# Patient Record
Sex: Female | Born: 1945
Health system: Southern US, Community
[De-identification: ages and names within clinical notes are randomized; demographics above are authoritative.]

## PROBLEM LIST (undated history)

## (undated) DIAGNOSIS — F419 Anxiety disorder, unspecified: Secondary | ICD-10-CM

## (undated) DIAGNOSIS — M810 Age-related osteoporosis without current pathological fracture: Secondary | ICD-10-CM

## (undated) DIAGNOSIS — Z87442 Personal history of urinary calculi: Secondary | ICD-10-CM

## (undated) DIAGNOSIS — N200 Calculus of kidney: Secondary | ICD-10-CM

## (undated) DIAGNOSIS — K219 Gastro-esophageal reflux disease without esophagitis: Secondary | ICD-10-CM

## (undated) DIAGNOSIS — C801 Malignant (primary) neoplasm, unspecified: Secondary | ICD-10-CM

## (undated) DIAGNOSIS — D649 Anemia, unspecified: Secondary | ICD-10-CM

## (undated) DIAGNOSIS — E785 Hyperlipidemia, unspecified: Secondary | ICD-10-CM

## (undated) DIAGNOSIS — M722 Plantar fascial fibromatosis: Secondary | ICD-10-CM

## (undated) DIAGNOSIS — K589 Irritable bowel syndrome without diarrhea: Secondary | ICD-10-CM

## (undated) DIAGNOSIS — R002 Palpitations: Secondary | ICD-10-CM

## (undated) DIAGNOSIS — L570 Actinic keratosis: Secondary | ICD-10-CM

## (undated) DIAGNOSIS — H9190 Unspecified hearing loss, unspecified ear: Secondary | ICD-10-CM

## (undated) HISTORY — DX: Actinic keratosis: L57.0

## (undated) HISTORY — PX: LITHOTRIPSY: SUR834

---

## 2007-04-12 ENCOUNTER — Ambulatory Visit: Payer: Self-pay | Admitting: Internal Medicine

## 2007-05-15 ENCOUNTER — Ambulatory Visit: Payer: Self-pay | Admitting: Gastroenterology

## 2008-05-09 ENCOUNTER — Ambulatory Visit: Payer: Self-pay | Admitting: Internal Medicine

## 2009-05-16 ENCOUNTER — Ambulatory Visit: Payer: Self-pay | Admitting: Internal Medicine

## 2009-12-01 ENCOUNTER — Ambulatory Visit: Payer: Self-pay | Admitting: Gastroenterology

## 2011-06-15 ENCOUNTER — Ambulatory Visit: Payer: Self-pay | Admitting: Internal Medicine

## 2012-07-18 ENCOUNTER — Ambulatory Visit: Payer: Self-pay | Admitting: Internal Medicine

## 2013-07-19 ENCOUNTER — Ambulatory Visit: Payer: Self-pay | Admitting: Internal Medicine

## 2014-07-12 ENCOUNTER — Other Ambulatory Visit: Payer: Self-pay | Admitting: Internal Medicine

## 2014-07-12 DIAGNOSIS — Z1231 Encounter for screening mammogram for malignant neoplasm of breast: Secondary | ICD-10-CM

## 2014-07-23 ENCOUNTER — Ambulatory Visit
Admission: RE | Admit: 2014-07-23 | Discharge: 2014-07-23 | Disposition: A | Discharge status: Home / Self Care | Payer: PPO | Source: Ambulatory Visit | Attending: Internal Medicine | Admitting: Internal Medicine

## 2014-07-23 DIAGNOSIS — Z1231 Encounter for screening mammogram for malignant neoplasm of breast: Secondary | ICD-10-CM

## 2015-02-01 ENCOUNTER — Encounter: Payer: Self-pay | Admitting: *Deleted

## 2015-02-03 ENCOUNTER — Ambulatory Visit
Admission: RE | Admit: 2015-02-03 | Discharge: 2015-02-03 | Disposition: A | Discharge status: Home / Self Care | Payer: PPO | Source: Ambulatory Visit | Attending: Gastroenterology | Admitting: Gastroenterology

## 2015-02-03 ENCOUNTER — Ambulatory Visit: Payer: PPO | Admitting: Anesthesiology

## 2015-02-03 ENCOUNTER — Encounter: Payer: Self-pay | Admitting: *Deleted

## 2015-02-03 ENCOUNTER — Encounter
Admission: RE | Disposition: A | Discharge status: Home / Self Care | Payer: Self-pay | Source: Ambulatory Visit | Attending: Gastroenterology

## 2015-02-03 DIAGNOSIS — Z79899 Other long term (current) drug therapy: Secondary | ICD-10-CM | POA: Insufficient documentation

## 2015-02-03 DIAGNOSIS — K640 First degree hemorrhoids: Secondary | ICD-10-CM | POA: Insufficient documentation

## 2015-02-03 DIAGNOSIS — K5730 Diverticulosis of large intestine without perforation or abscess without bleeding: Secondary | ICD-10-CM | POA: Diagnosis not present

## 2015-02-03 DIAGNOSIS — M810 Age-related osteoporosis without current pathological fracture: Secondary | ICD-10-CM | POA: Diagnosis not present

## 2015-02-03 DIAGNOSIS — K648 Other hemorrhoids: Secondary | ICD-10-CM | POA: Diagnosis not present

## 2015-02-03 DIAGNOSIS — Z1211 Encounter for screening for malignant neoplasm of colon: Secondary | ICD-10-CM | POA: Diagnosis not present

## 2015-02-03 DIAGNOSIS — E785 Hyperlipidemia, unspecified: Secondary | ICD-10-CM | POA: Insufficient documentation

## 2015-02-03 DIAGNOSIS — K219 Gastro-esophageal reflux disease without esophagitis: Secondary | ICD-10-CM | POA: Insufficient documentation

## 2015-02-03 DIAGNOSIS — Z86010 Personal history of colonic polyps: Secondary | ICD-10-CM | POA: Insufficient documentation

## 2015-02-03 DIAGNOSIS — K5790 Diverticulosis of intestine, part unspecified, without perforation or abscess without bleeding: Secondary | ICD-10-CM | POA: Diagnosis not present

## 2015-02-03 HISTORY — DX: Plantar fascial fibromatosis: M72.2

## 2015-02-03 HISTORY — DX: Hyperlipidemia, unspecified: E78.5

## 2015-02-03 HISTORY — DX: Calculus of kidney: N20.0

## 2015-02-03 HISTORY — PX: COLONOSCOPY WITH PROPOFOL: SHX5780

## 2015-02-03 HISTORY — DX: Gastro-esophageal reflux disease without esophagitis: K21.9

## 2015-02-03 HISTORY — DX: Age-related osteoporosis without current pathological fracture: M81.0

## 2015-02-03 HISTORY — DX: Irritable bowel syndrome, unspecified: K58.9

## 2015-02-03 SURGERY — COLONOSCOPY WITH PROPOFOL
Anesthesia: General

## 2015-02-03 MED ORDER — SODIUM CHLORIDE 0.9 % IV SOLN
INTRAVENOUS | Status: DC
  Administered 2015-02-03: 09:00:00 via INTRAVENOUS

## 2015-02-03 MED ORDER — FENTANYL CITRATE (PF) 100 MCG/2ML IJ SOLN
INTRAMUSCULAR | Status: DC | PRN
  Administered 2015-02-03: 50 ug via INTRAVENOUS

## 2015-02-03 MED ORDER — SODIUM CHLORIDE 0.9 % IV SOLN: INTRAVENOUS | Status: DC

## 2015-02-03 MED ORDER — MIDAZOLAM HCL 2 MG/2ML IJ SOLN
INTRAMUSCULAR | Status: DC | PRN
  Administered 2015-02-03: 1 mg via INTRAVENOUS

## 2015-02-03 MED ORDER — PROPOFOL 500 MG/50ML IV EMUL
INTRAVENOUS | Status: DC | PRN
  Administered 2015-02-03: 120 ug/kg/min via INTRAVENOUS

## 2015-02-03 NOTE — Anesthesia Procedure Notes (Signed)
Performed by: COOK-MARTIN, Emanie Behan Pre-anesthesia Checklist: Patient identified, Emergency Drugs available, Suction available, Patient being monitored and Timeout performed Patient Re-evaluated:Patient Re-evaluated prior to inductionOxygen Delivery Method: Nasal cannula Preoxygenation: Pre-oxygenation with 100% oxygen Intubation Type: IV induction Placement Confirmation: positive ETCO2 and CO2 detector       

## 2015-02-03 NOTE — Op Note (Signed)
[facility] Patient Name: Barbara Waters Procedure Date: 02/03/2015 10:06 AM MRN: [id number] Account #: [id number] Date of Birth: 1945/07/23 Admit Type: Outpatient Age: 70 Room: [facility] ENDO ROOM 3 Gender: Female Note Status: Finalized Procedure:         Colonoscopy Indications:       Personal history of colonic polyps Providers:         Lollie Sails, MD Referring MD:      Hewitt Blade. Sarina Ser, MD (Referring MD) Medicines:         Monitored Anesthesia Care Complications:     No immediate complications. Procedure:         Pre-Anesthesia Assessment:                    - ASA Grade Assessment: II - A patient with mild systemic                     disease.                    After obtaining informed consent, the colonoscope was                     passed under direct vision. Throughout the procedure, the                     patient's blood pressure, pulse, and oxygen saturations                     were monitored continuously. The Olympus PCF-H180AL                     colonoscope ( S#: [id number] ) was introduced through the                     anus and advanced to the the cecum, identified by                     appendiceal orifice and ileocecal valve. The colonoscopy                     was performed without difficulty. The patient tolerated                     the procedure well. The quality of the bowel preparation                     was good. Findings:      Multiple medium-mouthed diverticula were found in the sigmoid colon.      The exam was otherwise normal throughout the examined colon.      The digital rectal exam was normal.      Non-bleeding internal hemorrhoids were found during anoscopy. The       hemorrhoids were Grade I (internal hemorrhoids that do not prolapse). Impression:        - Diverticulosis in the sigmoid colon.                    - Non-bleeding internal hemorrhoids.                    - No specimens collected. Recommendation:     - Discharge patient to home. Procedure Code(s): --- Professional ---  45378, Colonoscopy, flexible; diagnostic, including                     collection of specimen(s) by brushing or washing, when                     performed (separate procedure) Diagnosis Code(s): --- Professional ---                    K64.0, First degree hemorrhoids                    Z86.010, Personal history of colonic polyps                    K57.30, Diverticulosis of large intestine without                     perforation or abscess without bleeding CPT copyright 2014 American Medical Association. All rights reserved. The codes documented in this report are preliminary and upon coder review may  be revised to meet current compliance requirements. Lollie Sails, MD 02/03/2015 10:24:37 AM This report has been signed electronically. Number of Addenda: 0 Note Initiated On: 02/03/2015 10:06 AM Scope Withdrawal Time: 0 hours 5 minutes 17 seconds  Total Procedure Duration: 0 hours 12 minutes 11 seconds       [facility]

## 2015-02-03 NOTE — Anesthesia Postprocedure Evaluation (Signed)
Anesthesia Post Note  Patient: Barbara Waters  Procedure(s) Performed: Procedure(s) (LRB): COLONOSCOPY WITH PROPOFOL (N/A)  Patient location during evaluation: Endoscopy Anesthesia Type: General Level of consciousness: awake and alert Pain management: pain level controlled Vital Signs Assessment: post-procedure vital signs reviewed and stable Respiratory status: spontaneous breathing, nonlabored ventilation, respiratory function stable and patient connected to nasal cannula oxygen Cardiovascular status: blood pressure returned to baseline and stable Postop Assessment: no signs of nausea or vomiting Anesthetic complications: no    Last Vitals:  Filed Vitals:   02/03/15 1040 02/03/15 1050  BP: 110/68 120/61  Pulse: 66 72  Temp:    Resp: 18 15    Last Pain: There were no vitals filed for this visit.               Martha Clan

## 2015-02-03 NOTE — H&P (Signed)
Outpatient short stay form Pre-procedure 02/03/2015 10:02 AM Barbara Sails MD  Primary Physician: Dr. Lisette Grinder  Reason for visit:  Colonoscopy  History of present illness:  Patient is a 70 year old female with a personal history of adenomatous colon polyps. She is presenting today for a colonoscopy. He tolerated her prep well. She takes Excedrin is on a daily basis but states that she has not taken any in the past 5 days. She takes no other aspirin or blood thinning products.    Current facility-administered medications:  .  0.9 %  sodium chloride infusion, , Intravenous, Continuous, Barbara Sails, MD, Last Rate: 20 mL/hr at 02/03/15 0850 .  0.9 %  sodium chloride infusion, , Intravenous, Continuous, Barbara Sails, MD  Prescriptions prior to admission  Medication Sig Dispense Refill Last Dose  . alendronate (FOSAMAX) 70 MG tablet Take 70 mg by mouth once a week. Take with a full glass of water on an empty stomach.     . pravastatin (PRAVACHOL) 40 MG tablet Take 40 mg by mouth daily.   02/02/2015 at Unknown time  . RABEprazole (ACIPHEX) 20 MG tablet Take 20 mg by mouth daily.   02/02/2015 at Unknown time     No Known Allergies   Past Medical History  Diagnosis Date  . GERD (gastroesophageal reflux disease)   . Nephrolithiasis   . IBS (irritable bowel syndrome)   . Osteoporosis   . Hyperlipidemia   . Plantar fascial fibromatosis     Review of systems:      Physical Exam    Heart and lungs: Regular rate and rhythm without rub or gallop, lungs are bilaterally clear.    HEENT: Normocephalic atraumatic eyes are anicteric    Other:     Pertinant exam for procedure: Soft nontender nondistended bowel sounds positive normoactive    Planned proceedures: Colonoscopy and indicated procedures. I have discussed the risks benefits and complications of procedures to include not limited to bleeding, infection, perforation and the risk of sedation and the patient wishes  to proceed.    Barbara Sails, MD Gastroenterology 02/03/2015  10:02 AM

## 2015-02-03 NOTE — Transfer of Care (Signed)
Immediate Anesthesia Transfer of Care Note  Patient: Barbara Waters  Procedure(s) Performed: Procedure(s): COLONOSCOPY WITH PROPOFOL (N/A)  Patient Location: PACU  Anesthesia Type:General  Level of Consciousness: awake and sedated  Airway & Oxygen Therapy: Patient Spontanous Breathing and Patient connected to nasal cannula oxygen  Post-op Assessment: Report given to RN and Post -op Vital signs reviewed and stable  Post vital signs: Reviewed and stable  Last Vitals:  Filed Vitals:   02/03/15 0825  BP: 134/71  Pulse: 86  Temp: 36.9 C  Resp: 14    Complications: No apparent anesthesia complications

## 2015-02-03 NOTE — Anesthesia Preprocedure Evaluation (Signed)
Anesthesia Evaluation  Patient identified by MRN, date of birth, ID band Patient awake    Reviewed: Allergy & Precautions, H&P , NPO status , Patient's Chart, lab work & pertinent test results, reviewed documented beta blocker date and time   History of Anesthesia Complications Negative for: history of anesthetic complications  Airway Mallampati: II  TM Distance: >3 FB Neck ROM: full    Dental no notable dental hx. (+) Upper Dentures, Lower Dentures   Pulmonary neg pulmonary ROS,    Pulmonary exam normal breath sounds clear to auscultation       Cardiovascular Exercise Tolerance: Good negative cardio ROS Normal cardiovascular exam Rhythm:regular Rate:Normal     Neuro/Psych negative neurological ROS  negative psych ROS   GI/Hepatic Neg liver ROS, GERD  Medicated,  Endo/Other  negative endocrine ROS  Renal/GU Renal disease (kidney stones)  negative genitourinary   Musculoskeletal   Abdominal   Peds  Hematology negative hematology ROS (+)   Anesthesia Other Findings Past Medical History:   GERD (gastroesophageal reflux disease)                       Nephrolithiasis                                              IBS (irritable bowel syndrome)                               Osteoporosis                                                 Hyperlipidemia                                               Plantar fascial fibromatosis                                 Reproductive/Obstetrics negative OB ROS                             Anesthesia Physical Anesthesia Plan  ASA: II  Anesthesia Plan: General   Post-op Pain Management:    Induction:   Airway Management Planned:   Additional Equipment:   Intra-op Plan:   Post-operative Plan:   Informed Consent: I have reviewed the patients History and Physical, chart, labs and discussed the procedure including the risks, benefits and alternatives  for the proposed anesthesia with the patient or authorized representative who has indicated his/her understanding and acceptance.   Dental Advisory Given  Plan Discussed with: Anesthesiologist, CRNA and Surgeon  Anesthesia Plan Comments:         Anesthesia Quick Evaluation

## 2015-02-05 ENCOUNTER — Encounter: Payer: Self-pay | Admitting: Gastroenterology

## 2015-03-13 DIAGNOSIS — H903 Sensorineural hearing loss, bilateral: Secondary | ICD-10-CM | POA: Diagnosis not present

## 2015-04-23 DIAGNOSIS — H2513 Age-related nuclear cataract, bilateral: Secondary | ICD-10-CM | POA: Diagnosis not present

## 2015-06-05 DIAGNOSIS — L719 Rosacea, unspecified: Secondary | ICD-10-CM | POA: Diagnosis not present

## 2015-06-05 DIAGNOSIS — L812 Freckles: Secondary | ICD-10-CM | POA: Diagnosis not present

## 2015-06-05 DIAGNOSIS — L821 Other seborrheic keratosis: Secondary | ICD-10-CM | POA: Diagnosis not present

## 2015-06-05 DIAGNOSIS — I781 Nevus, non-neoplastic: Secondary | ICD-10-CM | POA: Diagnosis not present

## 2015-06-05 DIAGNOSIS — L578 Other skin changes due to chronic exposure to nonionizing radiation: Secondary | ICD-10-CM | POA: Diagnosis not present

## 2015-06-05 DIAGNOSIS — I8393 Asymptomatic varicose veins of bilateral lower extremities: Secondary | ICD-10-CM | POA: Diagnosis not present

## 2015-06-05 DIAGNOSIS — Z1283 Encounter for screening for malignant neoplasm of skin: Secondary | ICD-10-CM | POA: Diagnosis not present

## 2015-06-05 DIAGNOSIS — D229 Melanocytic nevi, unspecified: Secondary | ICD-10-CM | POA: Diagnosis not present

## 2015-06-05 DIAGNOSIS — D1800 Hemangioma unspecified site: Secondary | ICD-10-CM | POA: Diagnosis not present

## 2015-07-07 ENCOUNTER — Other Ambulatory Visit: Payer: Self-pay | Admitting: Internal Medicine

## 2015-07-07 DIAGNOSIS — Z1231 Encounter for screening mammogram for malignant neoplasm of breast: Secondary | ICD-10-CM

## 2015-07-14 DIAGNOSIS — M810 Age-related osteoporosis without current pathological fracture: Secondary | ICD-10-CM | POA: Diagnosis not present

## 2015-07-14 DIAGNOSIS — Z0001 Encounter for general adult medical examination with abnormal findings: Secondary | ICD-10-CM | POA: Diagnosis not present

## 2015-07-14 DIAGNOSIS — R6889 Other general symptoms and signs: Secondary | ICD-10-CM | POA: Diagnosis not present

## 2015-07-14 DIAGNOSIS — E7800 Pure hypercholesterolemia, unspecified: Secondary | ICD-10-CM | POA: Diagnosis not present

## 2015-07-14 DIAGNOSIS — F411 Generalized anxiety disorder: Secondary | ICD-10-CM | POA: Diagnosis not present

## 2015-07-14 DIAGNOSIS — K219 Gastro-esophageal reflux disease without esophagitis: Secondary | ICD-10-CM | POA: Diagnosis not present

## 2015-07-23 DIAGNOSIS — M810 Age-related osteoporosis without current pathological fracture: Secondary | ICD-10-CM | POA: Diagnosis not present

## 2015-07-24 ENCOUNTER — Ambulatory Visit
Admission: RE | Admit: 2015-07-24 | Discharge: 2015-07-24 | Disposition: A | Discharge status: Home / Self Care | Payer: PPO | Source: Ambulatory Visit | Attending: Internal Medicine | Admitting: Internal Medicine

## 2015-07-24 ENCOUNTER — Other Ambulatory Visit: Payer: Self-pay | Admitting: Internal Medicine

## 2015-07-24 DIAGNOSIS — Z1231 Encounter for screening mammogram for malignant neoplasm of breast: Secondary | ICD-10-CM | POA: Diagnosis not present

## 2015-09-18 DIAGNOSIS — H2513 Age-related nuclear cataract, bilateral: Secondary | ICD-10-CM | POA: Diagnosis not present

## 2015-10-13 DIAGNOSIS — Z23 Encounter for immunization: Secondary | ICD-10-CM | POA: Diagnosis not present

## 2015-10-13 DIAGNOSIS — Z0000 Encounter for general adult medical examination without abnormal findings: Secondary | ICD-10-CM | POA: Diagnosis not present

## 2015-10-14 DIAGNOSIS — H2511 Age-related nuclear cataract, right eye: Secondary | ICD-10-CM | POA: Diagnosis not present

## 2015-10-16 ENCOUNTER — Encounter: Payer: Self-pay | Admitting: *Deleted

## 2015-10-21 ENCOUNTER — Encounter: Payer: Self-pay | Admitting: *Deleted

## 2015-10-21 ENCOUNTER — Ambulatory Visit: Payer: PPO | Admitting: Certified Registered Nurse Anesthetist

## 2015-10-21 ENCOUNTER — Ambulatory Visit
Admission: RE | Admit: 2015-10-21 | Discharge: 2015-10-21 | Disposition: A | Discharge status: Home / Self Care | Payer: PPO | Source: Ambulatory Visit | Attending: Ophthalmology | Admitting: Ophthalmology

## 2015-10-21 ENCOUNTER — Encounter
Admission: RE | Disposition: A | Discharge status: Home / Self Care | Payer: Self-pay | Source: Ambulatory Visit | Attending: Ophthalmology

## 2015-10-21 DIAGNOSIS — E785 Hyperlipidemia, unspecified: Secondary | ICD-10-CM | POA: Diagnosis not present

## 2015-10-21 DIAGNOSIS — K219 Gastro-esophageal reflux disease without esophagitis: Secondary | ICD-10-CM | POA: Insufficient documentation

## 2015-10-21 DIAGNOSIS — K589 Irritable bowel syndrome without diarrhea: Secondary | ICD-10-CM | POA: Insufficient documentation

## 2015-10-21 DIAGNOSIS — D649 Anemia, unspecified: Secondary | ICD-10-CM | POA: Diagnosis not present

## 2015-10-21 DIAGNOSIS — M810 Age-related osteoporosis without current pathological fracture: Secondary | ICD-10-CM | POA: Diagnosis not present

## 2015-10-21 DIAGNOSIS — H2511 Age-related nuclear cataract, right eye: Secondary | ICD-10-CM | POA: Insufficient documentation

## 2015-10-21 DIAGNOSIS — Z79899 Other long term (current) drug therapy: Secondary | ICD-10-CM | POA: Diagnosis not present

## 2015-10-21 DIAGNOSIS — F419 Anxiety disorder, unspecified: Secondary | ICD-10-CM | POA: Diagnosis not present

## 2015-10-21 HISTORY — DX: Anemia, unspecified: D64.9

## 2015-10-21 HISTORY — DX: Palpitations: R00.2

## 2015-10-21 HISTORY — PX: CATARACT EXTRACTION W/PHACO: SHX586

## 2015-10-21 HISTORY — DX: Anxiety disorder, unspecified: F41.9

## 2015-10-21 HISTORY — DX: Unspecified hearing loss, unspecified ear: H91.90

## 2015-10-21 SURGERY — PHACOEMULSIFICATION, CATARACT, WITH IOL INSERTION
Anesthesia: Monitor Anesthesia Care | Site: Eye | Laterality: Right | Wound class: Clean

## 2015-10-21 MED ORDER — LIDOCAINE HCL 3.5 % OP GEL
OPHTHALMIC | Status: AC
  Administered 2015-10-21: 1 via OPHTHALMIC
  Filled 2015-10-21: qty 1

## 2015-10-21 MED ORDER — ARMC OPHTHALMIC DILATING DROPS
1.0000 "application " | OPHTHALMIC | Status: AC
  Administered 2015-10-21 (×3): 1 via OPHTHALMIC
  Filled 2015-10-21: qty 0.4

## 2015-10-21 MED ORDER — MOXIFLOXACIN HCL 0.5 % OP SOLN
1.0000 [drp] | OPHTHALMIC | Status: AC
  Administered 2015-10-21 (×3): 1 [drp] via OPHTHALMIC

## 2015-10-21 MED ORDER — LIDOCAINE HCL 3.5 % OP GEL
1.0000 "application " | Freq: Once | OPHTHALMIC | Status: AC
  Administered 2015-10-21: 1 via OPHTHALMIC

## 2015-10-21 MED ORDER — EPINEPHRINE HCL 1 MG/ML IJ SOLN
INTRAOCULAR | Status: DC | PRN
  Administered 2015-10-21: 1 mL via OPHTHALMIC

## 2015-10-21 MED ORDER — TETRACAINE HCL 0.5 % OP SOLN
OPHTHALMIC | Status: AC
  Administered 2015-10-21: 1 [drp] via OPHTHALMIC
  Filled 2015-10-21: qty 2

## 2015-10-21 MED ORDER — POVIDONE-IODINE 5 % OP SOLN
1.0000 "application " | Freq: Once | OPHTHALMIC | Status: AC
  Administered 2015-10-21: 1 via OPHTHALMIC

## 2015-10-21 MED ORDER — EPINEPHRINE HCL 1 MG/ML IJ SOLN
INTRAMUSCULAR | Status: AC
  Filled 2015-10-21: qty 1

## 2015-10-21 MED ORDER — MOXIFLOXACIN HCL 0.5 % OP SOLN
OPHTHALMIC | Status: DC | PRN
  Administered 2015-10-21: 1 [drp] via OPHTHALMIC

## 2015-10-21 MED ORDER — CARBACHOL 0.01 % IO SOLN
INTRAOCULAR | Status: DC | PRN
  Administered 2015-10-21: 0.5 mL via INTRAOCULAR

## 2015-10-21 MED ORDER — TETRACAINE HCL 0.5 % OP SOLN
1.0000 [drp] | Freq: Once | OPHTHALMIC | Status: AC
  Administered 2015-10-21: 1 [drp] via OPHTHALMIC

## 2015-10-21 MED ORDER — NA CHONDROIT SULF-NA HYALURON 40-17 MG/ML IO SOLN
INTRAOCULAR | Status: DC | PRN
  Administered 2015-10-21: 1 mL via INTRAOCULAR

## 2015-10-21 MED ORDER — MOXIFLOXACIN HCL 0.5 % OP SOLN
OPHTHALMIC | Status: AC
  Administered 2015-10-21: 1 [drp] via OPHTHALMIC
  Filled 2015-10-21: qty 3

## 2015-10-21 MED ORDER — NA CHONDROIT SULF-NA HYALURON 40-17 MG/ML IO SOLN
INTRAOCULAR | Status: AC
  Filled 2015-10-21: qty 1

## 2015-10-21 MED ORDER — SODIUM CHLORIDE 0.9 % IV SOLN
INTRAVENOUS | Status: DC
  Administered 2015-10-21: 09:00:00 via INTRAVENOUS

## 2015-10-21 MED ORDER — POVIDONE-IODINE 5 % OP SOLN
OPHTHALMIC | Status: AC
  Administered 2015-10-21: 1 via OPHTHALMIC
  Filled 2015-10-21: qty 30

## 2015-10-21 MED ORDER — MIDAZOLAM HCL 2 MG/2ML IJ SOLN
INTRAMUSCULAR | Status: DC | PRN
  Administered 2015-10-21: 1 mg via INTRAVENOUS

## 2015-10-21 SURGICAL SUPPLY — 21 items
CANNULA ANT/CHMB 27GA (MISCELLANEOUS) ×3 IMPLANT
CUP MEDICINE 2OZ PLAST GRAD ST (MISCELLANEOUS) ×3 IMPLANT
GLOVE BIO SURGEON STRL SZ8 (GLOVE) ×3 IMPLANT
GLOVE BIOGEL M 6.5 STRL (GLOVE) ×3 IMPLANT
GLOVE SURG LX 8.0 MICRO (GLOVE) ×2
GLOVE SURG LX STRL 8.0 MICRO (GLOVE) ×1 IMPLANT
GOWN STRL REUS W/ TWL LRG LVL3 (GOWN DISPOSABLE) ×2 IMPLANT
GOWN STRL REUS W/TWL LRG LVL3 (GOWN DISPOSABLE) ×4
LENS IOL TECNIS ITEC 20.0 (Intraocular Lens) ×3 IMPLANT
PACK CATARACT (MISCELLANEOUS) ×3 IMPLANT
PACK CATARACT BRASINGTON LX (MISCELLANEOUS) ×3 IMPLANT
PACK EYE AFTER SURG (MISCELLANEOUS) ×3 IMPLANT
SOL BSS BAG (MISCELLANEOUS) ×3
SOL PREP PVP 2OZ (MISCELLANEOUS) ×3
SOLUTION BSS BAG (MISCELLANEOUS) ×1 IMPLANT
SOLUTION PREP PVP 2OZ (MISCELLANEOUS) ×1 IMPLANT
SYR 3ML LL SCALE MARK (SYRINGE) ×3 IMPLANT
SYR 5ML LL (SYRINGE) ×3 IMPLANT
SYR TB 1ML 27GX1/2 LL (SYRINGE) ×3 IMPLANT
WATER STERILE IRR 250ML POUR (IV SOLUTION) ×3 IMPLANT
WIPE NON LINTING 3.25X3.25 (MISCELLANEOUS) ×3 IMPLANT

## 2015-10-21 NOTE — Anesthesia Preprocedure Evaluation (Signed)
Anesthesia Evaluation  Patient identified by MRN, date of birth, ID band Patient awake    Reviewed: Allergy & Precautions, H&P , NPO status , Patient's Chart, lab work & pertinent test results, reviewed documented beta blocker date and time   History of Anesthesia Complications Negative for: history of anesthetic complications  Airway Mallampati: II  TM Distance: >3 FB Neck ROM: full    Dental no notable dental hx. (+) Upper Dentures, Lower Dentures   Pulmonary neg pulmonary ROS,    Pulmonary exam normal breath sounds clear to auscultation       Cardiovascular Exercise Tolerance: Good negative cardio ROS Normal cardiovascular exam Rhythm:regular Rate:Normal     Neuro/Psych negative neurological ROS  negative psych ROS   GI/Hepatic Neg liver ROS, GERD  Medicated,  Endo/Other  negative endocrine ROS  Renal/GU Renal disease (kidney stones)  negative genitourinary   Musculoskeletal   Abdominal   Peds  Hematology  (+) Blood dyscrasia, anemia ,   Anesthesia Other Findings Past Medical History:   GERD (gastroesophageal reflux disease)                       Nephrolithiasis                                              IBS (irritable bowel syndrome)                               Osteoporosis                                                 Hyperlipidemia                                               Plantar fascial fibromatosis                                 Reproductive/Obstetrics negative OB ROS                             Anesthesia Physical  Anesthesia Plan  ASA: II  Anesthesia Plan: MAC   Post-op Pain Management:    Induction:   Airway Management Planned:   Additional Equipment:   Intra-op Plan:   Post-operative Plan:   Informed Consent: I have reviewed the patients History and Physical, chart, labs and discussed the procedure including the risks, benefits and alternatives  for the proposed anesthesia with the patient or authorized representative who has indicated his/her understanding and acceptance.   Dental Advisory Given  Plan Discussed with: Anesthesiologist, CRNA and Surgeon  Anesthesia Plan Comments:         Anesthesia Quick Evaluation

## 2015-10-21 NOTE — Op Note (Signed)
PREOPERATIVE DIAGNOSIS:  Nuclear sclerotic cataract of the right eye.   POSTOPERATIVE DIAGNOSIS:  NUCLEAR SCLEROTIC CATARACT RIGHT EYE   OPERATIVE PROCEDURE: Procedure(s): CATARACT EXTRACTION PHACO AND INTRAOCULAR LENS PLACEMENT (IOC)   SURGEON:  Birder Robson, MD.   ANESTHESIA:  Anesthesiologist: Martha Clan, MD CRNA: Demetrius Charity, CRNA  1.      Managed anesthesia care. 2.      Topical tetracaine drops followed by 2% Xylocaine jelly applied in the preoperative holding area.   COMPLICATIONS:  None.   TECHNIQUE:   Stop and chop   DESCRIPTION OF PROCEDURE:  The patient was examined and consented in the preoperative holding area where the aforementioned topical anesthesia was applied to the right eye and then brought back to the Operating Room where the right eye was prepped and draped in the usual sterile ophthalmic fashion and a lid speculum was placed. A paracentesis was created with the side port blade and the anterior chamber was filled with viscoelastic. A near clear corneal incision was performed with the steel keratome. A continuous curvilinear capsulorrhexis was performed with a cystotome followed by the capsulorrhexis forceps. Hydrodissection and hydrodelineation were carried out with BSS on a blunt cannula. The lens was removed in a stop and chop  technique and the remaining cortical material was removed with the irrigation-aspiration handpiece. The capsular bag was inflated with viscoelastic and the Technis [id number]  lens was placed in the capsular bag without complication. The remaining viscoelastic was removed from the eye with the irrigation-aspiration handpiece. The wounds were hydrated. The anterior chamber was flushed with Miostat and the eye was inflated to physiologic pressure. 0.2 mL of Vigamox diluted three/one with BSS was placed in the anterior chamber. The wounds were found to be water tight. The eye was dressed with Vigamox. The patient was given protective glasses to  wear throughout the day and a shield with which to sleep tonight. The patient was also given drops with which to begin a drop regimen today and will follow-up with me in one day.  Implant Name Type Inv. Item Serial No. Manufacturer Lot No. LRB No. Used  LENS IOL DIOP 20.0 - [id number] Intraocular Lens LENS IOL DIOP 20.0 [id number]   Right 1   Procedure(s) with comments: CATARACT EXTRACTION PHACO AND INTRAOCULAR LENS PLACEMENT (IOC) (Right) - [place] 53.5 AP% 17.7 CDE 9.49 Fluid Pack Lot # [id number]  Electronically signed: Rosselyn Martha LOUIS 10/21/2015 10:45 AM

## 2015-10-21 NOTE — H&P (Signed)
All labs reviewed. Abnormal studies sent to patients PCP when indicated.  Previous H&P reviewed, patient examined, there are NO CHANGES.  Barbara Waters LOUIS10/3/201710:11 AM

## 2015-10-21 NOTE — Anesthesia Postprocedure Evaluation (Signed)
Anesthesia Post Note  Patient: Barbara Waters  Procedure(s) Performed: Procedure(s) (LRB): CATARACT EXTRACTION PHACO AND INTRAOCULAR LENS PLACEMENT (IOC) (Right)  Patient location during evaluation: PACU Anesthesia Type: MAC Level of consciousness: awake and alert and oriented Pain management: satisfactory to patient Vital Signs Assessment: post-procedure vital signs reviewed and stable Respiratory status: respiratory function stable Cardiovascular status: stable Anesthetic complications: no    Last Vitals:  Vitals:   10/21/15 0910  BP: 130/70  Pulse: 68  Resp: 20  Temp: 37 C    Last Pain:  Vitals:   10/21/15 0910  TempSrc: Oral                 Blima Singer

## 2015-10-21 NOTE — Transfer of Care (Signed)
Immediate Anesthesia Transfer of Care Note  Patient: Barbara Waters  Procedure(s) Performed: Procedure(s) with comments: CATARACT EXTRACTION PHACO AND INTRAOCULAR LENS PLACEMENT (IOC) (Right) - [place] 53.5 AP% 17.7 CDE 9.49 Fluid Pack Lot # [id number]  Patient Location: PACU  Anesthesia Type:MAC  Level of Consciousness: awake, alert  and oriented  Airway & Oxygen Therapy: Patient Spontanous Breathing  Post-op Assessment: Report given to RN and Post -op Vital signs reviewed and stable  Post vital signs: Reviewed and stable  Last Vitals:  Vitals:   10/21/15 0910  BP: 130/70  Pulse: 68  Resp: 20  Temp: 37 C    Last Pain:  Vitals:   10/21/15 0910  TempSrc: Oral         Complications: No apparent anesthesia complications

## 2015-10-21 NOTE — Anesthesia Procedure Notes (Signed)
Procedure Name: MAC Performed by: Asael Pann Pre-anesthesia Checklist: Patient identified, Emergency Drugs available, Suction available, Patient being monitored and Timeout performed Oxygen Delivery Method: Nasal cannula       

## 2015-10-21 NOTE — Discharge Instructions (Signed)
Eye Surgery Discharge Instructions  Expect mild scratchy sensation or mild soreness. DO NOT RUB YOUR EYE!  The day of surgery:  Minimal physical activity, but bed rest is not required  No reading, computer work, or close hand work  No bending, lifting, or straining.  May watch TV  For 24 hours:  No driving, legal decisions, or alcoholic beverages  Safety precautions  Eat anything you prefer: It is better to start with liquids, then soup then solid foods.  _____ Eye patch should be worn until postoperative exam tomorrow.  ____ Solar shield eyeglasses should be worn for comfort in the sunlight/patch while sleeping  Resume all regular medications including aspirin or Coumadin if these were discontinued prior to surgery. You may shower, bathe, shave, or wash your hair. Tylenol may be taken for mild discomfort.  Call your doctor if you experience significant pain, nausea, or vomiting, fever > 101 or other signs of infection. [phone] or [phone] Specific instructions:  Follow-up Information    PORFILIO,WILLIAM LOUIS, MD. Go in 1 day(s).   Specialty:  Ophthalmology Why:  Appointment time is set for 940 am : [employer] information: [address] [place] [address] [phone]

## 2015-11-06 DIAGNOSIS — H2512 Age-related nuclear cataract, left eye: Secondary | ICD-10-CM | POA: Diagnosis not present

## 2015-11-10 ENCOUNTER — Encounter: Payer: Self-pay | Admitting: *Deleted

## 2015-11-11 ENCOUNTER — Ambulatory Visit
Admission: RE | Admit: 2015-11-11 | Discharge: 2015-11-11 | Disposition: A | Discharge status: Home / Self Care | Payer: PPO | Source: Ambulatory Visit | Attending: Ophthalmology | Admitting: Ophthalmology

## 2015-11-11 ENCOUNTER — Encounter: Payer: Self-pay | Admitting: *Deleted

## 2015-11-11 ENCOUNTER — Ambulatory Visit: Payer: PPO | Admitting: Certified Registered Nurse Anesthetist

## 2015-11-11 ENCOUNTER — Encounter
Admission: RE | Disposition: A | Discharge status: Home / Self Care | Payer: Self-pay | Source: Ambulatory Visit | Attending: Ophthalmology

## 2015-11-11 DIAGNOSIS — E785 Hyperlipidemia, unspecified: Secondary | ICD-10-CM | POA: Diagnosis not present

## 2015-11-11 DIAGNOSIS — M810 Age-related osteoporosis without current pathological fracture: Secondary | ICD-10-CM | POA: Insufficient documentation

## 2015-11-11 DIAGNOSIS — D649 Anemia, unspecified: Secondary | ICD-10-CM | POA: Diagnosis not present

## 2015-11-11 DIAGNOSIS — K589 Irritable bowel syndrome without diarrhea: Secondary | ICD-10-CM | POA: Diagnosis not present

## 2015-11-11 DIAGNOSIS — K219 Gastro-esophageal reflux disease without esophagitis: Secondary | ICD-10-CM | POA: Insufficient documentation

## 2015-11-11 DIAGNOSIS — Z79899 Other long term (current) drug therapy: Secondary | ICD-10-CM | POA: Insufficient documentation

## 2015-11-11 DIAGNOSIS — H2512 Age-related nuclear cataract, left eye: Secondary | ICD-10-CM | POA: Diagnosis not present

## 2015-11-11 DIAGNOSIS — F419 Anxiety disorder, unspecified: Secondary | ICD-10-CM | POA: Diagnosis not present

## 2015-11-11 HISTORY — DX: Personal history of urinary calculi: Z87.442

## 2015-11-11 HISTORY — PX: CATARACT EXTRACTION W/PHACO: SHX586

## 2015-11-11 SURGERY — PHACOEMULSIFICATION, CATARACT, WITH IOL INSERTION
Anesthesia: Monitor Anesthesia Care | Site: Eye | Laterality: Left | Wound class: Clean

## 2015-11-11 MED ORDER — NA CHONDROIT SULF-NA HYALURON 40-17 MG/ML IO SOLN
INTRAOCULAR | Status: AC
  Filled 2015-11-11: qty 1

## 2015-11-11 MED ORDER — ONDANSETRON HCL 4 MG/2ML IJ SOLN: 4.0000 mg | Freq: Once | INTRAMUSCULAR | Status: DC | PRN

## 2015-11-11 MED ORDER — LIDOCAINE HCL (PF) 4 % IJ SOLN
INTRAMUSCULAR | Status: AC
  Filled 2015-11-11: qty 5

## 2015-11-11 MED ORDER — MIDAZOLAM HCL 2 MG/2ML IJ SOLN
INTRAMUSCULAR | Status: DC | PRN
  Administered 2015-11-11: 2 mg via INTRAVENOUS

## 2015-11-11 MED ORDER — MOXIFLOXACIN HCL 0.5 % OP SOLN
OPHTHALMIC | Status: DC | PRN
  Administered 2015-11-11: 9 [drp] via OPHTHALMIC

## 2015-11-11 MED ORDER — MOXIFLOXACIN HCL 0.5 % OP SOLN
1.0000 [drp] | OPHTHALMIC | Status: AC
  Administered 2015-11-11 (×3): 1 [drp] via OPHTHALMIC

## 2015-11-11 MED ORDER — NA CHONDROIT SULF-NA HYALURON 40-17 MG/ML IO SOLN
INTRAOCULAR | Status: DC | PRN
  Administered 2015-11-11: 1 mL via INTRAOCULAR

## 2015-11-11 MED ORDER — FENTANYL CITRATE (PF) 100 MCG/2ML IJ SOLN: 25.0000 ug | INTRAMUSCULAR | Status: DC | PRN

## 2015-11-11 MED ORDER — BSS IO SOLN
INTRAOCULAR | Status: DC | PRN
  Administered 2015-11-11: 4 mL via OPHTHALMIC

## 2015-11-11 MED ORDER — SODIUM CHLORIDE 0.9 % IV SOLN
INTRAVENOUS | Status: DC
Start: 2015-11-11 — End: 2015-11-11
  Administered 2015-11-11: 10:00:00 via INTRAVENOUS

## 2015-11-11 MED ORDER — EPINEPHRINE PF 1 MG/ML IJ SOLN
INTRAMUSCULAR | Status: AC
  Filled 2015-11-11: qty 2

## 2015-11-11 MED ORDER — ARMC OPHTHALMIC DILATING DROPS
OPHTHALMIC | Status: AC
  Administered 2015-11-11: 1 via OPHTHALMIC
  Filled 2015-11-11: qty 0.4

## 2015-11-11 MED ORDER — CARBACHOL 0.01 % IO SOLN
INTRAOCULAR | Status: DC | PRN
  Administered 2015-11-11: 0.5 mL via INTRAOCULAR

## 2015-11-11 MED ORDER — MOXIFLOXACIN HCL 0.5 % OP SOLN
OPHTHALMIC | Status: AC
  Administered 2015-11-11: 1 [drp] via OPHTHALMIC
  Filled 2015-11-11: qty 3

## 2015-11-11 MED ORDER — EPINEPHRINE PF 1 MG/ML IJ SOLN
INTRAOCULAR | Status: DC | PRN
  Administered 2015-11-11: 200 mL via OPHTHALMIC

## 2015-11-11 MED ORDER — ARMC OPHTHALMIC DILATING DROPS
1.0000 "application " | OPHTHALMIC | Status: AC
  Administered 2015-11-11 (×3): 1 via OPHTHALMIC

## 2015-11-11 MED ORDER — POVIDONE-IODINE 5 % OP SOLN
OPHTHALMIC | Status: AC
  Filled 2015-11-11: qty 30

## 2015-11-11 SURGICAL SUPPLY — 21 items
CANNULA ANT/CHMB 27GA (MISCELLANEOUS) ×3 IMPLANT
CUP MEDICINE 2OZ PLAST GRAD ST (MISCELLANEOUS) ×3 IMPLANT
GLOVE BIO SURGEON STRL SZ8 (GLOVE) ×3 IMPLANT
GLOVE BIOGEL M 6.5 STRL (GLOVE) ×3 IMPLANT
GLOVE SURG LX 8.0 MICRO (GLOVE) ×2
GLOVE SURG LX STRL 8.0 MICRO (GLOVE) ×1 IMPLANT
GOWN STRL REUS W/ TWL LRG LVL3 (GOWN DISPOSABLE) ×2 IMPLANT
GOWN STRL REUS W/TWL LRG LVL3 (GOWN DISPOSABLE) ×4
LENS IOL TECNIS ITEC 20.0 (Intraocular Lens) ×3 IMPLANT
PACK CATARACT (MISCELLANEOUS) ×3 IMPLANT
PACK CATARACT BRASINGTON LX (MISCELLANEOUS) ×3 IMPLANT
PACK EYE AFTER SURG (MISCELLANEOUS) ×3 IMPLANT
SOL BSS BAG (MISCELLANEOUS) ×3
SOL PREP PVP 2OZ (MISCELLANEOUS) ×3
SOLUTION BSS BAG (MISCELLANEOUS) ×1 IMPLANT
SOLUTION PREP PVP 2OZ (MISCELLANEOUS) ×1 IMPLANT
SYR 3ML LL SCALE MARK (SYRINGE) ×3 IMPLANT
SYR 5ML LL (SYRINGE) ×3 IMPLANT
SYR TB 1ML 27GX1/2 LL (SYRINGE) ×3 IMPLANT
WATER STERILE IRR 250ML POUR (IV SOLUTION) ×3 IMPLANT
WIPE NON LINTING 3.25X3.25 (MISCELLANEOUS) ×3 IMPLANT

## 2015-11-11 NOTE — Discharge Instructions (Signed)

## 2015-11-11 NOTE — Anesthesia Preprocedure Evaluation (Signed)
Anesthesia Evaluation  Patient identified by MRN, date of birth, ID band Patient awake    Reviewed: Allergy & Precautions, H&P , NPO status , Patient's Chart, lab work & pertinent test results, reviewed documented beta blocker date and time   History of Anesthesia Complications Negative for: history of anesthetic complications  Airway Mallampati: II  TM Distance: >3 FB Neck ROM: full    Dental no notable dental hx. (+) Upper Dentures, Lower Dentures   Pulmonary neg pulmonary ROS,    Pulmonary exam normal breath sounds clear to auscultation       Cardiovascular Exercise Tolerance: Good negative cardio ROS Normal cardiovascular exam Rhythm:regular Rate:Normal     Neuro/Psych Anxiety negative neurological ROS  negative psych ROS   GI/Hepatic Neg liver ROS, GERD  Medicated,IBS   Endo/Other  negative endocrine ROS  Renal/GU Renal disease (kidney stones)stones  negative genitourinary   Musculoskeletal negative musculoskeletal ROS (+)   Abdominal   Peds negative pediatric ROS (+)  Hematology  (+) Blood dyscrasia, anemia ,   Anesthesia Other Findings Past Medical History: No date: Anemia No date: Anxiety No date: GERD (gastroesophageal reflux disease) No date: History of kidney stones No date: HOH (hard of hearing)     Comment: Hearing aids No date: Hyperlipidemia No date: IBS (irritable bowel syndrome) No date: Nephrolithiasis No date: Osteoporosis No date: Palpitations No date: Plantar fascial fibromatosis  Reproductive/Obstetrics negative OB ROS                             Anesthesia Physical  Anesthesia Plan  ASA: III  Anesthesia Plan: MAC   Post-op Pain Management:    Induction: Intravenous  Airway Management Planned: Nasal Cannula  Additional Equipment:   Intra-op Plan:   Post-operative Plan:   Informed Consent: I have reviewed the patients History and Physical,  chart, labs and discussed the procedure including the risks, benefits and alternatives for the proposed anesthesia with the patient or authorized representative who has indicated his/her understanding and acceptance.   Dental advisory given  Plan Discussed with: CRNA and Surgeon  Anesthesia Plan Comments:         Anesthesia Quick Evaluation

## 2015-11-11 NOTE — Transfer of Care (Signed)
Immediate Anesthesia Transfer of Care Note  Patient: Barbara Waters  Procedure(s) Performed: Procedure(s) with comments: CATARACT EXTRACTION PHACO AND INTRAOCULAR LENS PLACEMENT ([place]) (Left) - Lot# [id number] H [place]: 00:52.3 AP%: 23.7 CDE: 12.40  Patient Location: PACU  Anesthesia Type:MAC  Level of Consciousness: awake, alert  and oriented  Airway & Oxygen Therapy: Patient Spontanous Breathing  Post-op Assessment: Report given to RN and Post -op Vital signs reviewed and stable  Post vital signs: Reviewed and stable  Last Vitals:  Vitals:   11/11/15 0915  BP: 130/77  Pulse: 74  Resp: 18  Temp: 36.6 C    Last Pain:  Vitals:   11/11/15 0915  TempSrc: Oral         Complications: No apparent anesthesia complications

## 2015-11-11 NOTE — Anesthesia Postprocedure Evaluation (Signed)
Anesthesia Post Note  Patient: Barbara Waters  Procedure(s) Performed: Procedure(s) (LRB): CATARACT EXTRACTION PHACO AND INTRAOCULAR LENS PLACEMENT (IOC) (Left)  Patient location during evaluation: PACU Anesthesia Type: MAC Level of consciousness: awake and alert and oriented Pain management: satisfactory to patient Vital Signs Assessment: post-procedure vital signs reviewed and stable Respiratory status: spontaneous breathing Cardiovascular status: stable Anesthetic complications: no    Last Vitals:  Vitals:   11/11/15 0915  BP: 130/77  Pulse: 74  Resp: 18  Temp: 36.6 C    Last Pain:  Vitals:   11/11/15 0915  TempSrc: Oral                 Blima Singer

## 2015-11-11 NOTE — Op Note (Signed)
PREOPERATIVE DIAGNOSIS:  Nuclear sclerotic cataract of the left eye.   POSTOPERATIVE DIAGNOSIS:  Nuclear sclerotic cataract of the left eye.   OPERATIVE PROCEDURE: Procedure(s): CATARACT EXTRACTION PHACO AND INTRAOCULAR LENS PLACEMENT (IOC)   SURGEON:  Birder Robson, MD.   ANESTHESIA:  Anesthesiologist: Alvin Critchley, MD CRNA: Demetrius Charity, CRNA  1.      Managed anesthesia care. 2.     0.88ml of Shugarcaine was instilled following the paracentesis   COMPLICATIONS:  None.   TECHNIQUE:   Stop and chop   DESCRIPTION OF PROCEDURE:  The patient was examined and consented in the preoperative holding area where the aforementioned topical anesthesia was applied to the left eye and then brought back to the Operating Room where the left eye was prepped and draped in the usual sterile ophthalmic fashion and a lid speculum was placed. A paracentesis was created with the side port blade and the anterior chamber was filled with viscoelastic. A near clear corneal incision was performed with the steel keratome. A continuous curvilinear capsulorrhexis was performed with a cystotome followed by the capsulorrhexis forceps. Hydrodissection and hydrodelineation were carried out with BSS on a blunt cannula. The lens was removed in a stop and chop  technique and the remaining cortical material was removed with the irrigation-aspiration handpiece. The capsular bag was inflated with viscoelastic and the Technis [id number] lens was placed in the capsular bag without complication. The remaining viscoelastic was removed from the eye with the irrigation-aspiration handpiece. The wounds were hydrated. The anterior chamber was flushed with Miostat and the eye was inflated to physiologic pressure. 0.72ml Vigamox was placed in the anterior chamber. The wounds were found to be water tight. The eye was dressed with Vigamox. The patient was given protective glasses to wear throughout the day and a shield with which to sleep tonight.  The patient was also given drops with which to begin a drop regimen today and will follow-up with me in one day.  Implant Name Type Inv. Item Serial No. Manufacturer Lot No. LRB No. Used  LENS IOL DIOP 20.0 - [id number] Intraocular Lens LENS IOL DIOP 20.0 [id number]   Left 1    Procedure(s) with comments: CATARACT EXTRACTION PHACO AND INTRAOCULAR LENS PLACEMENT (IOC) (Left) - Lot# [id number] H [place]: 00:52.3 AP%: 23.7 CDE: 12.40  Electronically signed: [place] 11/11/2015 10:43 AM

## 2015-11-11 NOTE — H&P (Signed)
All labs reviewed. Abnormal studies sent to patients PCP when indicated.  Previous H&P reviewed, patient examined, there are NO CHANGES.  Barbara Laverne LOUIS10/24/201710:19 AM

## 2015-11-11 NOTE — Anesthesia Procedure Notes (Signed)
Procedure Name: MAC Performed by: Demetrius Charity Pre-anesthesia Checklist: Patient identified, Emergency Drugs available, Suction available, Timeout performed and Patient being monitored Oxygen Delivery Method: Nasal cannula

## 2016-06-07 DIAGNOSIS — D485 Neoplasm of uncertain behavior of skin: Secondary | ICD-10-CM | POA: Diagnosis not present

## 2016-06-07 DIAGNOSIS — L821 Other seborrheic keratosis: Secondary | ICD-10-CM | POA: Diagnosis not present

## 2016-06-07 DIAGNOSIS — D229 Melanocytic nevi, unspecified: Secondary | ICD-10-CM | POA: Diagnosis not present

## 2016-06-07 DIAGNOSIS — C44319 Basal cell carcinoma of skin of other parts of face: Secondary | ICD-10-CM | POA: Diagnosis not present

## 2016-06-07 DIAGNOSIS — L812 Freckles: Secondary | ICD-10-CM | POA: Diagnosis not present

## 2016-06-07 DIAGNOSIS — Z1283 Encounter for screening for malignant neoplasm of skin: Secondary | ICD-10-CM | POA: Diagnosis not present

## 2016-06-07 DIAGNOSIS — L718 Other rosacea: Secondary | ICD-10-CM | POA: Diagnosis not present

## 2016-06-07 DIAGNOSIS — L820 Inflamed seborrheic keratosis: Secondary | ICD-10-CM | POA: Diagnosis not present

## 2016-06-07 DIAGNOSIS — L578 Other skin changes due to chronic exposure to nonionizing radiation: Secondary | ICD-10-CM | POA: Diagnosis not present

## 2016-06-16 DIAGNOSIS — Z961 Presence of intraocular lens: Secondary | ICD-10-CM | POA: Diagnosis not present

## 2016-07-13 DIAGNOSIS — Z85828 Personal history of other malignant neoplasm of skin: Secondary | ICD-10-CM

## 2016-07-13 DIAGNOSIS — C44319 Basal cell carcinoma of skin of other parts of face: Secondary | ICD-10-CM | POA: Diagnosis not present

## 2016-07-13 HISTORY — DX: Personal history of other malignant neoplasm of skin: Z85.828

## 2016-07-19 DIAGNOSIS — C44319 Basal cell carcinoma of skin of other parts of face: Secondary | ICD-10-CM | POA: Diagnosis not present

## 2016-10-04 DIAGNOSIS — E7800 Pure hypercholesterolemia, unspecified: Secondary | ICD-10-CM | POA: Diagnosis not present

## 2016-10-13 DIAGNOSIS — E7800 Pure hypercholesterolemia, unspecified: Secondary | ICD-10-CM | POA: Diagnosis not present

## 2016-10-13 DIAGNOSIS — R413 Other amnesia: Secondary | ICD-10-CM | POA: Diagnosis not present

## 2016-10-13 DIAGNOSIS — Z0001 Encounter for general adult medical examination with abnormal findings: Secondary | ICD-10-CM | POA: Diagnosis not present

## 2016-10-13 DIAGNOSIS — K219 Gastro-esophageal reflux disease without esophagitis: Secondary | ICD-10-CM | POA: Diagnosis not present

## 2016-10-13 DIAGNOSIS — M810 Age-related osteoporosis without current pathological fracture: Secondary | ICD-10-CM | POA: Diagnosis not present

## 2016-10-13 DIAGNOSIS — Z1231 Encounter for screening mammogram for malignant neoplasm of breast: Secondary | ICD-10-CM | POA: Diagnosis not present

## 2016-10-26 DIAGNOSIS — F0390 Unspecified dementia without behavioral disturbance: Secondary | ICD-10-CM | POA: Diagnosis not present

## 2017-01-04 DIAGNOSIS — E538 Deficiency of other specified B group vitamins: Secondary | ICD-10-CM | POA: Diagnosis not present

## 2017-01-04 DIAGNOSIS — E7800 Pure hypercholesterolemia, unspecified: Secondary | ICD-10-CM | POA: Diagnosis not present

## 2017-01-25 DIAGNOSIS — E7800 Pure hypercholesterolemia, unspecified: Secondary | ICD-10-CM | POA: Diagnosis not present

## 2017-01-25 DIAGNOSIS — K219 Gastro-esophageal reflux disease without esophagitis: Secondary | ICD-10-CM | POA: Diagnosis not present

## 2017-01-25 DIAGNOSIS — Z5181 Encounter for therapeutic drug level monitoring: Secondary | ICD-10-CM | POA: Diagnosis not present

## 2017-01-25 DIAGNOSIS — F0390 Unspecified dementia without behavioral disturbance: Secondary | ICD-10-CM | POA: Diagnosis not present

## 2017-01-26 DIAGNOSIS — F068 Other specified mental disorders due to known physiological condition: Secondary | ICD-10-CM | POA: Diagnosis not present

## 2017-01-26 DIAGNOSIS — F0390 Unspecified dementia without behavioral disturbance: Secondary | ICD-10-CM | POA: Diagnosis not present

## 2017-04-19 DIAGNOSIS — E7800 Pure hypercholesterolemia, unspecified: Secondary | ICD-10-CM | POA: Diagnosis not present

## 2017-04-19 DIAGNOSIS — Z5181 Encounter for therapeutic drug level monitoring: Secondary | ICD-10-CM | POA: Diagnosis not present

## 2017-04-26 DIAGNOSIS — F0390 Unspecified dementia without behavioral disturbance: Secondary | ICD-10-CM | POA: Diagnosis not present

## 2017-04-26 DIAGNOSIS — E7800 Pure hypercholesterolemia, unspecified: Secondary | ICD-10-CM | POA: Diagnosis not present

## 2017-04-26 DIAGNOSIS — M810 Age-related osteoporosis without current pathological fracture: Secondary | ICD-10-CM | POA: Diagnosis not present

## 2017-04-26 DIAGNOSIS — F068 Other specified mental disorders due to known physiological condition: Secondary | ICD-10-CM | POA: Diagnosis not present

## 2017-04-26 DIAGNOSIS — K219 Gastro-esophageal reflux disease without esophagitis: Secondary | ICD-10-CM | POA: Diagnosis not present

## 2017-04-26 DIAGNOSIS — Z5181 Encounter for therapeutic drug level monitoring: Secondary | ICD-10-CM | POA: Diagnosis not present

## 2017-06-06 DIAGNOSIS — Z1283 Encounter for screening for malignant neoplasm of skin: Secondary | ICD-10-CM | POA: Diagnosis not present

## 2017-06-06 DIAGNOSIS — D1800 Hemangioma unspecified site: Secondary | ICD-10-CM | POA: Diagnosis not present

## 2017-06-06 DIAGNOSIS — D225 Melanocytic nevi of trunk: Secondary | ICD-10-CM | POA: Diagnosis not present

## 2017-06-06 DIAGNOSIS — Z85828 Personal history of other malignant neoplasm of skin: Secondary | ICD-10-CM | POA: Diagnosis not present

## 2017-06-06 DIAGNOSIS — L812 Freckles: Secondary | ICD-10-CM | POA: Diagnosis not present

## 2017-06-06 DIAGNOSIS — L821 Other seborrheic keratosis: Secondary | ICD-10-CM | POA: Diagnosis not present

## 2017-06-06 DIAGNOSIS — L309 Dermatitis, unspecified: Secondary | ICD-10-CM | POA: Diagnosis not present

## 2017-06-06 DIAGNOSIS — L578 Other skin changes due to chronic exposure to nonionizing radiation: Secondary | ICD-10-CM | POA: Diagnosis not present

## 2017-06-06 DIAGNOSIS — L820 Inflamed seborrheic keratosis: Secondary | ICD-10-CM | POA: Diagnosis not present

## 2017-10-07 DIAGNOSIS — Z5181 Encounter for therapeutic drug level monitoring: Secondary | ICD-10-CM | POA: Diagnosis not present

## 2017-10-14 DIAGNOSIS — F0390 Unspecified dementia without behavioral disturbance: Secondary | ICD-10-CM | POA: Diagnosis not present

## 2017-10-14 DIAGNOSIS — Z1239 Encounter for other screening for malignant neoplasm of breast: Secondary | ICD-10-CM | POA: Diagnosis not present

## 2017-10-14 DIAGNOSIS — Z0000 Encounter for general adult medical examination without abnormal findings: Secondary | ICD-10-CM | POA: Diagnosis not present

## 2017-10-14 DIAGNOSIS — E7800 Pure hypercholesterolemia, unspecified: Secondary | ICD-10-CM | POA: Diagnosis not present

## 2017-10-14 DIAGNOSIS — K219 Gastro-esophageal reflux disease without esophagitis: Secondary | ICD-10-CM | POA: Diagnosis not present

## 2017-10-14 DIAGNOSIS — Z0001 Encounter for general adult medical examination with abnormal findings: Secondary | ICD-10-CM | POA: Diagnosis not present

## 2017-10-14 DIAGNOSIS — F068 Other specified mental disorders due to known physiological condition: Secondary | ICD-10-CM | POA: Diagnosis not present

## 2017-10-25 DIAGNOSIS — F068 Other specified mental disorders due to known physiological condition: Secondary | ICD-10-CM | POA: Diagnosis not present

## 2017-10-25 DIAGNOSIS — F0390 Unspecified dementia without behavioral disturbance: Secondary | ICD-10-CM | POA: Diagnosis not present

## 2017-11-03 ENCOUNTER — Other Ambulatory Visit: Payer: Self-pay | Admitting: Internal Medicine

## 2017-11-03 DIAGNOSIS — Z1231 Encounter for screening mammogram for malignant neoplasm of breast: Secondary | ICD-10-CM

## 2017-11-14 DIAGNOSIS — B351 Tinea unguium: Secondary | ICD-10-CM | POA: Diagnosis not present

## 2017-11-14 DIAGNOSIS — L600 Ingrowing nail: Secondary | ICD-10-CM | POA: Diagnosis not present

## 2017-11-14 DIAGNOSIS — M79674 Pain in right toe(s): Secondary | ICD-10-CM | POA: Diagnosis not present

## 2017-11-14 DIAGNOSIS — M79675 Pain in left toe(s): Secondary | ICD-10-CM | POA: Diagnosis not present

## 2017-11-30 ENCOUNTER — Ambulatory Visit
Admission: RE | Admit: 2017-11-30 | Discharge: 2017-11-30 | Disposition: A | Discharge status: Home / Self Care | Payer: PPO | Source: Ambulatory Visit | Attending: Internal Medicine | Admitting: Internal Medicine

## 2017-11-30 DIAGNOSIS — Z1231 Encounter for screening mammogram for malignant neoplasm of breast: Secondary | ICD-10-CM | POA: Insufficient documentation

## 2017-11-30 HISTORY — DX: Malignant (primary) neoplasm, unspecified: C80.1

## 2018-01-26 IMAGING — MG 2D DIGITAL SCREENING BILATERAL MAMMOGRAM WITH CAD AND ADJUNCT TO
8 of 12 series · 8 of 28 positions shown · non-contrast
Comparison: Previous exam(s).

CLINICAL DATA: Screening.

EXAM:
2D DIGITAL SCREENING BILATERAL MAMMOGRAM WITH CAD AND ADJUNCT TOMO

[L CC synth-2D]
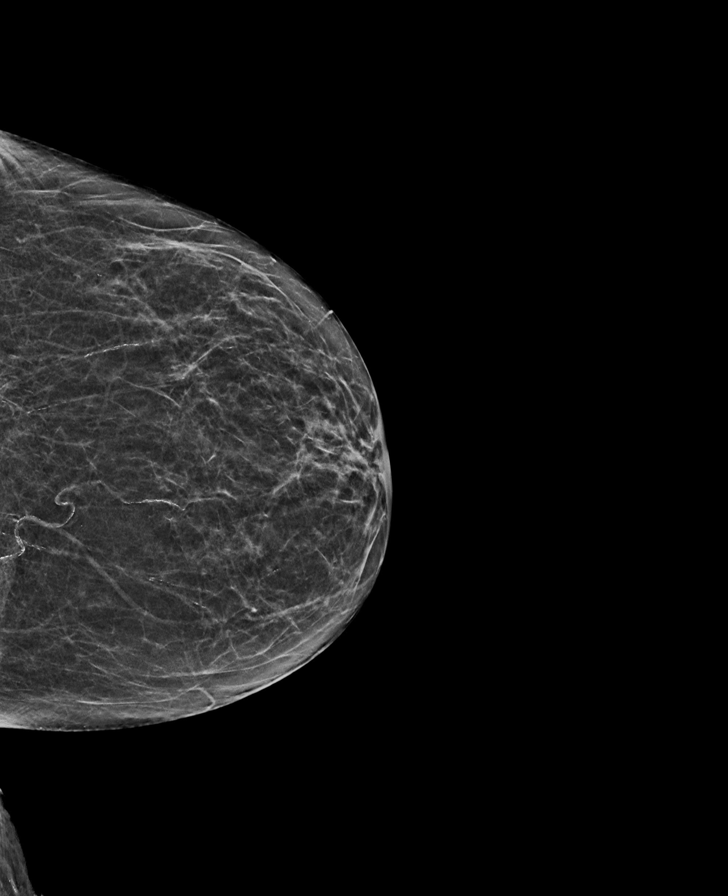

[R CC]
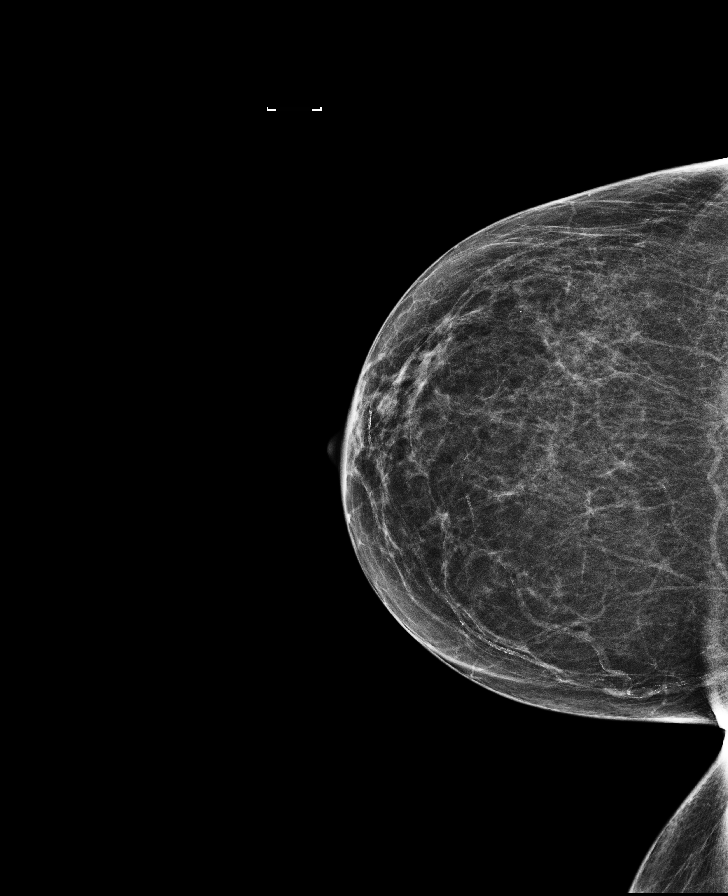

[R MLO synth-2D]
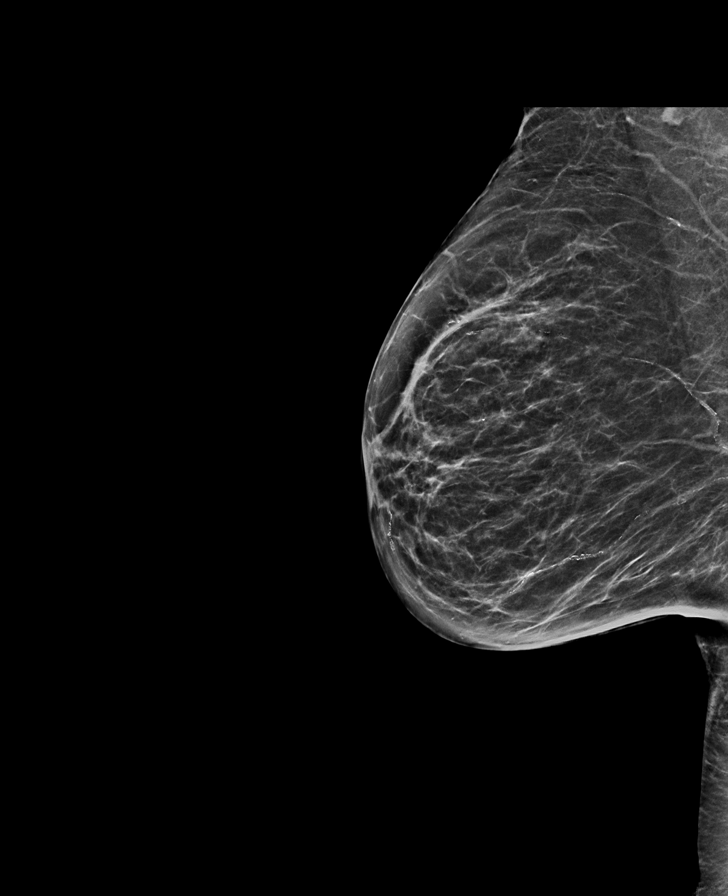

[R MLO]
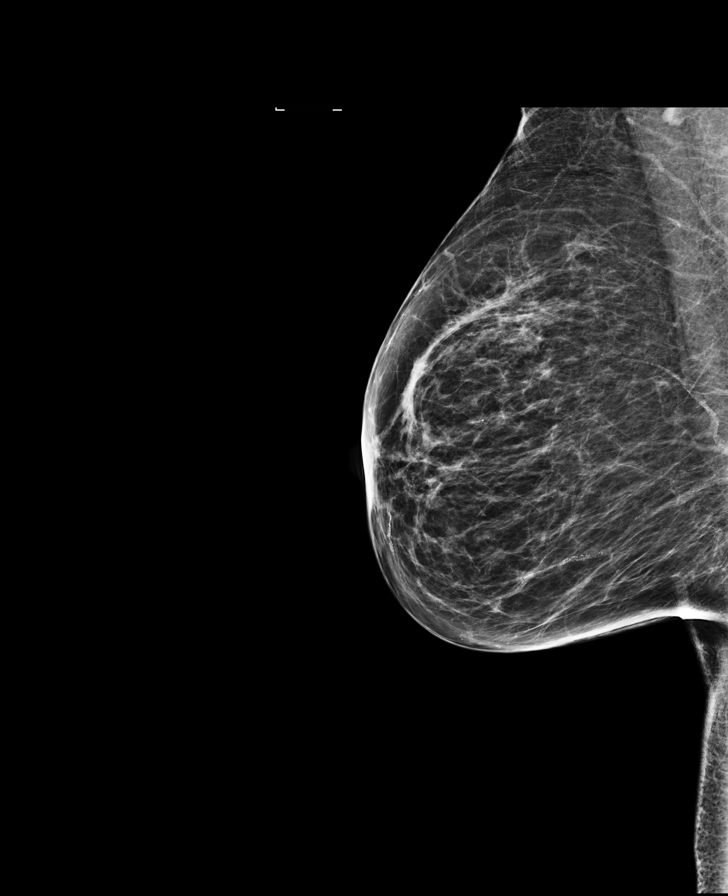

[R CC synth-2D]
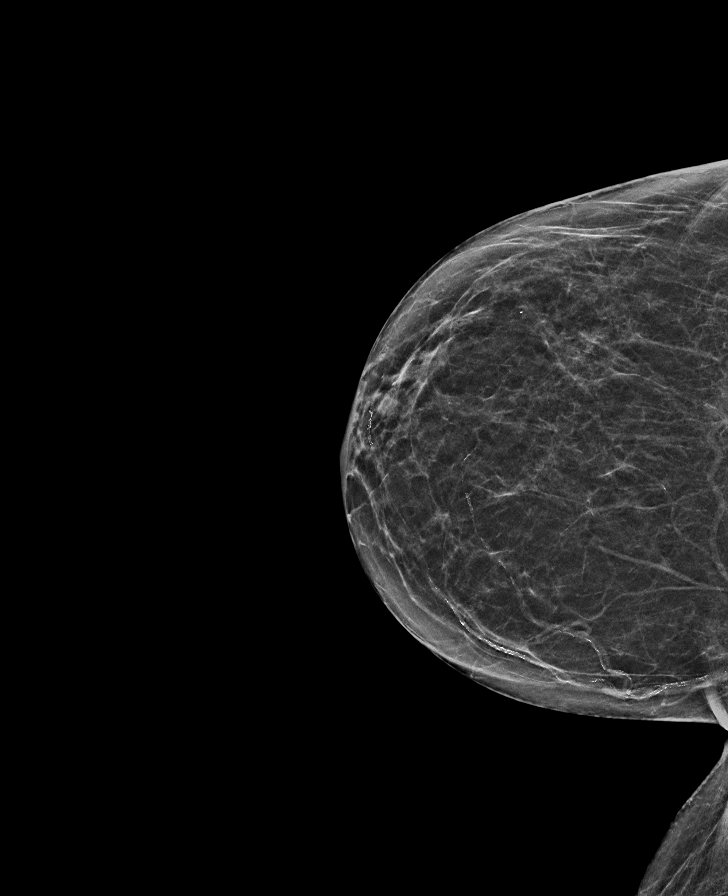

[L CC]
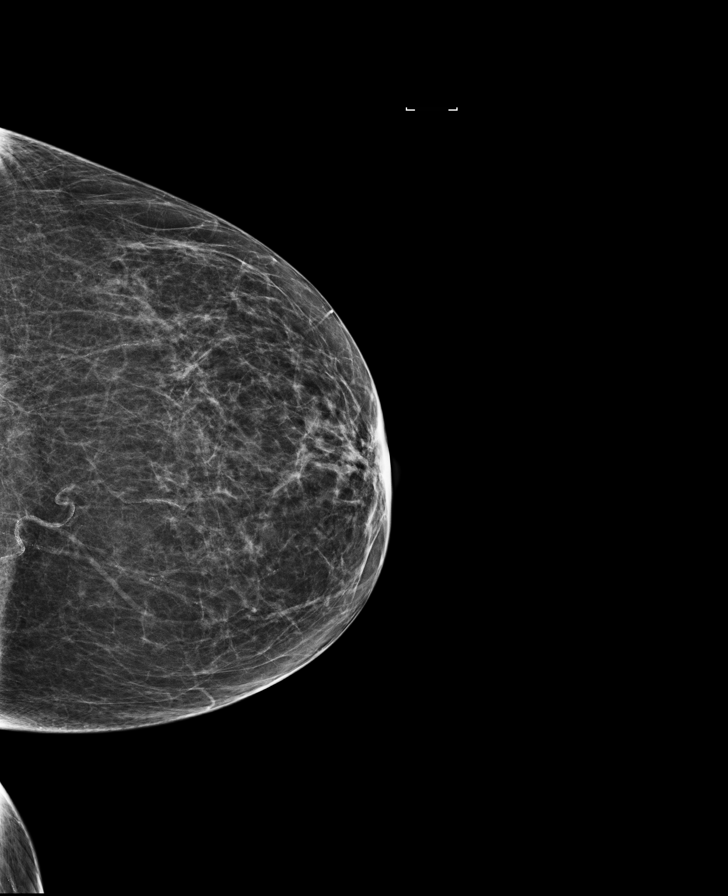

[L MLO]
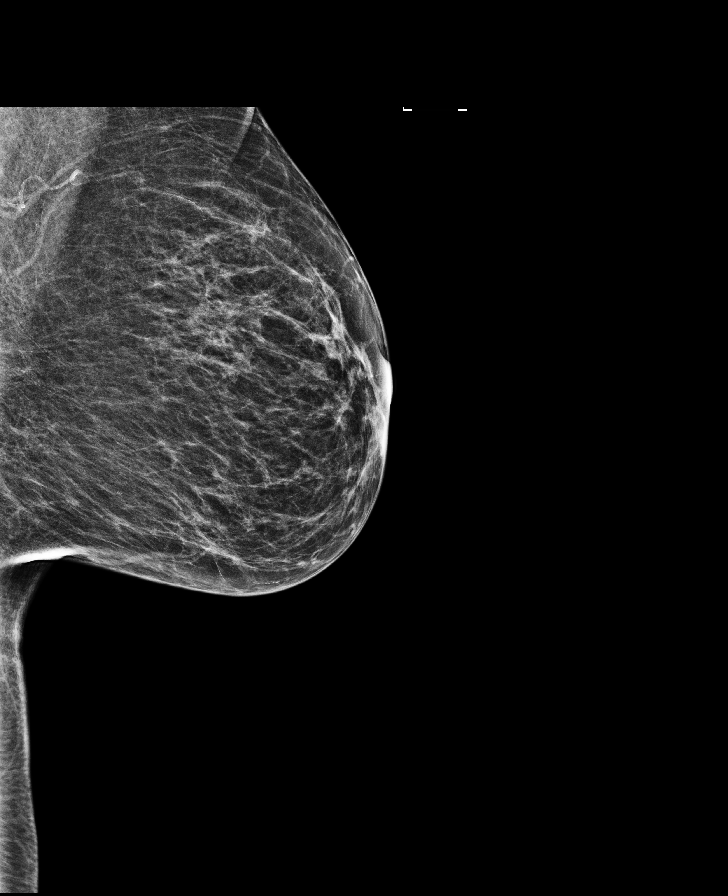

[L MLO synth-2D]
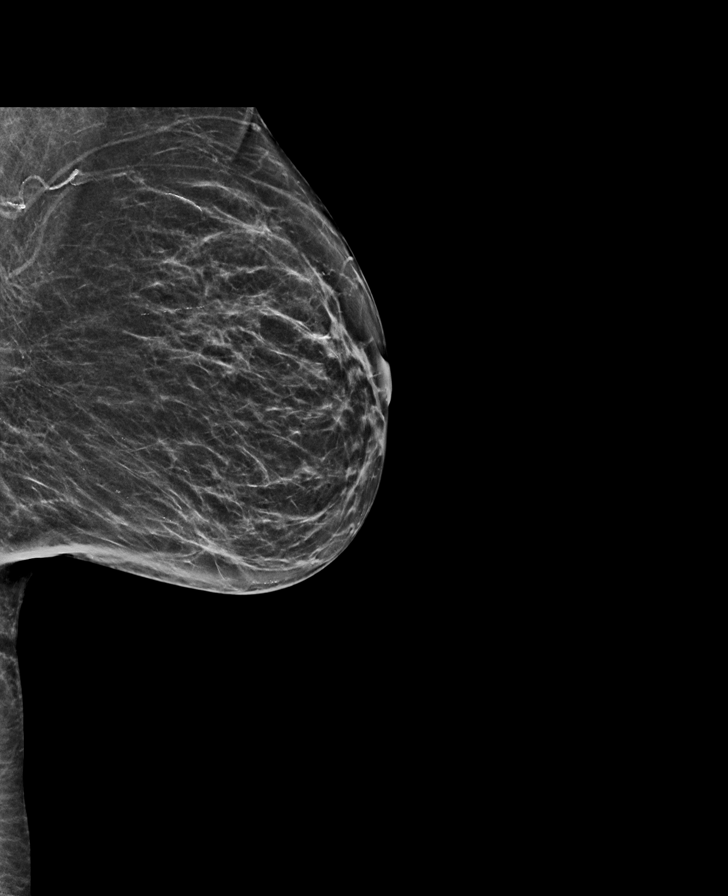

[8 of 28 positions shown; findings below may reference images not displayed]

ACR Breast Density Category b: There are scattered areas of
fibroglandular density.
FINDINGS: There are no findings suspicious for malignancy. Images were
processed with CAD.
IMPRESSION: No mammographic evidence of malignancy. A result letter of this
screening mammogram will be mailed directly to the patient.

RECOMMENDATION:
Screening mammogram in one year. (Code:[id number])

BI-RADS CATEGORY  1: Negative.

## 2018-05-22 DIAGNOSIS — L578 Other skin changes due to chronic exposure to nonionizing radiation: Secondary | ICD-10-CM | POA: Diagnosis not present

## 2018-05-22 DIAGNOSIS — C44319 Basal cell carcinoma of skin of other parts of face: Secondary | ICD-10-CM | POA: Diagnosis not present

## 2018-05-22 DIAGNOSIS — Z85828 Personal history of other malignant neoplasm of skin: Secondary | ICD-10-CM | POA: Diagnosis not present

## 2018-05-22 DIAGNOSIS — D485 Neoplasm of uncertain behavior of skin: Secondary | ICD-10-CM | POA: Diagnosis not present

## 2018-06-06 DIAGNOSIS — F0390 Unspecified dementia without behavioral disturbance: Secondary | ICD-10-CM | POA: Diagnosis not present

## 2018-07-05 DIAGNOSIS — Z1283 Encounter for screening for malignant neoplasm of skin: Secondary | ICD-10-CM | POA: Diagnosis not present

## 2018-07-05 DIAGNOSIS — L821 Other seborrheic keratosis: Secondary | ICD-10-CM | POA: Diagnosis not present

## 2018-07-05 DIAGNOSIS — Z85828 Personal history of other malignant neoplasm of skin: Secondary | ICD-10-CM | POA: Diagnosis not present

## 2018-07-05 DIAGNOSIS — D1800 Hemangioma unspecified site: Secondary | ICD-10-CM | POA: Diagnosis not present

## 2018-07-05 DIAGNOSIS — D229 Melanocytic nevi, unspecified: Secondary | ICD-10-CM | POA: Diagnosis not present

## 2018-07-05 DIAGNOSIS — L814 Other melanin hyperpigmentation: Secondary | ICD-10-CM | POA: Diagnosis not present

## 2018-07-05 DIAGNOSIS — L308 Other specified dermatitis: Secondary | ICD-10-CM | POA: Diagnosis not present

## 2018-07-05 DIAGNOSIS — D2230 Melanocytic nevi of unspecified part of face: Secondary | ICD-10-CM | POA: Diagnosis not present

## 2018-07-05 DIAGNOSIS — D225 Melanocytic nevi of trunk: Secondary | ICD-10-CM | POA: Diagnosis not present

## 2018-07-05 DIAGNOSIS — L578 Other skin changes due to chronic exposure to nonionizing radiation: Secondary | ICD-10-CM | POA: Diagnosis not present

## 2018-10-23 DIAGNOSIS — K219 Gastro-esophageal reflux disease without esophagitis: Secondary | ICD-10-CM | POA: Diagnosis not present

## 2018-10-23 DIAGNOSIS — E7800 Pure hypercholesterolemia, unspecified: Secondary | ICD-10-CM | POA: Diagnosis not present

## 2018-10-30 DIAGNOSIS — E7800 Pure hypercholesterolemia, unspecified: Secondary | ICD-10-CM | POA: Diagnosis not present

## 2018-10-30 DIAGNOSIS — N390 Urinary tract infection, site not specified: Secondary | ICD-10-CM | POA: Diagnosis not present

## 2018-10-30 DIAGNOSIS — Z0000 Encounter for general adult medical examination without abnormal findings: Secondary | ICD-10-CM | POA: Diagnosis not present

## 2018-10-30 DIAGNOSIS — F068 Other specified mental disorders due to known physiological condition: Secondary | ICD-10-CM | POA: Diagnosis not present

## 2018-10-30 DIAGNOSIS — M810 Age-related osteoporosis without current pathological fracture: Secondary | ICD-10-CM | POA: Diagnosis not present

## 2018-10-30 DIAGNOSIS — F0390 Unspecified dementia without behavioral disturbance: Secondary | ICD-10-CM | POA: Diagnosis not present

## 2018-10-30 DIAGNOSIS — K219 Gastro-esophageal reflux disease without esophagitis: Secondary | ICD-10-CM | POA: Diagnosis not present

## 2018-11-01 DIAGNOSIS — D220 Melanocytic nevi of lip: Secondary | ICD-10-CM | POA: Diagnosis not present

## 2018-11-01 DIAGNOSIS — C4401 Basal cell carcinoma of skin of lip: Secondary | ICD-10-CM | POA: Diagnosis not present

## 2018-11-01 DIAGNOSIS — L578 Other skin changes due to chronic exposure to nonionizing radiation: Secondary | ICD-10-CM | POA: Diagnosis not present

## 2018-11-01 DIAGNOSIS — L820 Inflamed seborrheic keratosis: Secondary | ICD-10-CM | POA: Diagnosis not present

## 2018-11-01 DIAGNOSIS — L570 Actinic keratosis: Secondary | ICD-10-CM | POA: Diagnosis not present

## 2018-11-01 DIAGNOSIS — Z85828 Personal history of other malignant neoplasm of skin: Secondary | ICD-10-CM | POA: Diagnosis not present

## 2018-12-06 DIAGNOSIS — F068 Other specified mental disorders due to known physiological condition: Secondary | ICD-10-CM | POA: Diagnosis not present

## 2018-12-06 DIAGNOSIS — F0390 Unspecified dementia without behavioral disturbance: Secondary | ICD-10-CM | POA: Diagnosis not present

## 2019-01-30 ENCOUNTER — Ambulatory Visit: Payer: Medicare Other | Attending: Internal Medicine

## 2019-01-30 DIAGNOSIS — Z23 Encounter for immunization: Secondary | ICD-10-CM | POA: Insufficient documentation

## 2019-01-30 NOTE — Progress Notes (Signed)
   Covid-19 Vaccination Clinic  Name:  Barbara Waters    MRN: [id number] DOB: [birth year]  01/30/2019  [name] was observed post Covid-19 immunization for 15 minutes without incidence. She was provided with Vaccine Information Sheet and instruction to access the V-Safe system.   [name] was instructed to call 911 with any severe reactions post vaccine: Marland Kitchen Difficulty breathing  . Swelling of your face and throat  . A fast heartbeat  . A bad rash all over your body  . Dizziness and weakness    Immunizations Administered    Name Date Dose VIS Date Route   Pfizer COVID-19 Vaccine 01/30/2019 11:22 AM 0.3 mL 12/29/2018 Intramuscular   Manufacturer: [place]   Lot: [id number]   [place]: [id number]

## 2019-02-01 DIAGNOSIS — L578 Other skin changes due to chronic exposure to nonionizing radiation: Secondary | ICD-10-CM | POA: Diagnosis not present

## 2019-02-01 DIAGNOSIS — L700 Acne vulgaris: Secondary | ICD-10-CM | POA: Diagnosis not present

## 2019-02-01 DIAGNOSIS — L570 Actinic keratosis: Secondary | ICD-10-CM | POA: Diagnosis not present

## 2019-02-01 DIAGNOSIS — Z85828 Personal history of other malignant neoplasm of skin: Secondary | ICD-10-CM | POA: Diagnosis not present

## 2019-02-18 ENCOUNTER — Ambulatory Visit: Payer: PPO | Attending: Internal Medicine

## 2019-02-18 DIAGNOSIS — Z23 Encounter for immunization: Secondary | ICD-10-CM

## 2019-02-18 NOTE — Progress Notes (Signed)
   Covid-19 Vaccination Clinic  Name:  Barbara Waters    MRN: [id number] DOB: [birth year]  02/18/2019  [name] was observed post Covid-19 immunization for 15 minutes without incidence. She was provided with Vaccine Information Sheet and instruction to access the V-Safe system.   [name] was instructed to call 911 with any severe reactions post vaccine: Marland Kitchen Difficulty breathing  . Swelling of your face and throat  . A fast heartbeat  . A bad rash all over your body  . Dizziness and weakness    Immunizations Administered    Name Date Dose VIS Date Route   Pfizer COVID-19 Vaccine 02/18/2019  9:58 AM 0.3 mL 12/29/2018 Intramuscular   Manufacturer: [place]   Lot: [id number]   [place]: [id number]

## 2019-03-14 DIAGNOSIS — I781 Nevus, non-neoplastic: Secondary | ICD-10-CM | POA: Diagnosis not present

## 2019-03-14 DIAGNOSIS — L570 Actinic keratosis: Secondary | ICD-10-CM | POA: Diagnosis not present

## 2019-03-14 DIAGNOSIS — L578 Other skin changes due to chronic exposure to nonionizing radiation: Secondary | ICD-10-CM | POA: Diagnosis not present

## 2019-03-14 DIAGNOSIS — Z85828 Personal history of other malignant neoplasm of skin: Secondary | ICD-10-CM | POA: Diagnosis not present

## 2019-04-30 DIAGNOSIS — Z0001 Encounter for general adult medical examination with abnormal findings: Secondary | ICD-10-CM | POA: Diagnosis not present

## 2019-04-30 DIAGNOSIS — F0390 Unspecified dementia without behavioral disturbance: Secondary | ICD-10-CM | POA: Diagnosis not present

## 2019-04-30 DIAGNOSIS — Z0000 Encounter for general adult medical examination without abnormal findings: Secondary | ICD-10-CM | POA: Diagnosis not present

## 2019-04-30 DIAGNOSIS — M810 Age-related osteoporosis without current pathological fracture: Secondary | ICD-10-CM | POA: Diagnosis not present

## 2019-04-30 DIAGNOSIS — K219 Gastro-esophageal reflux disease without esophagitis: Secondary | ICD-10-CM | POA: Diagnosis not present

## 2019-04-30 DIAGNOSIS — E7800 Pure hypercholesterolemia, unspecified: Secondary | ICD-10-CM | POA: Diagnosis not present

## 2019-04-30 DIAGNOSIS — F068 Other specified mental disorders due to known physiological condition: Secondary | ICD-10-CM | POA: Diagnosis not present

## 2019-06-06 DIAGNOSIS — E038 Other specified hypothyroidism: Secondary | ICD-10-CM | POA: Diagnosis not present

## 2019-06-06 DIAGNOSIS — F0390 Unspecified dementia without behavioral disturbance: Secondary | ICD-10-CM | POA: Diagnosis not present

## 2019-06-06 DIAGNOSIS — E559 Vitamin D deficiency, unspecified: Secondary | ICD-10-CM | POA: Diagnosis not present

## 2019-06-06 DIAGNOSIS — E611 Iron deficiency: Secondary | ICD-10-CM | POA: Diagnosis not present

## 2019-06-06 DIAGNOSIS — E519 Thiamine deficiency, unspecified: Secondary | ICD-10-CM | POA: Diagnosis not present

## 2019-06-06 DIAGNOSIS — F068 Other specified mental disorders due to known physiological condition: Secondary | ICD-10-CM | POA: Diagnosis not present

## 2019-06-06 DIAGNOSIS — E538 Deficiency of other specified B group vitamins: Secondary | ICD-10-CM | POA: Diagnosis not present

## 2019-07-12 ENCOUNTER — Other Ambulatory Visit: Payer: Self-pay

## 2019-07-12 ENCOUNTER — Ambulatory Visit: Payer: PPO | Admitting: Dermatology

## 2019-07-12 DIAGNOSIS — I781 Nevus, non-neoplastic: Secondary | ICD-10-CM

## 2019-07-12 DIAGNOSIS — L719 Rosacea, unspecified: Secondary | ICD-10-CM

## 2019-07-12 DIAGNOSIS — L720 Epidermal cyst: Secondary | ICD-10-CM | POA: Diagnosis not present

## 2019-07-12 NOTE — Progress Notes (Signed)
   Follow-Up Visit   Subjective  Barbara Waters is a 74 y.o. female who presents for the following: Follow-up.  Patient here today to follow up on lesion at right of midline upper lip. Patient was using Aczone and the spot seems to have improved. Nothing new or changing today that patient is aware of.   The following portions of the chart were reviewed this encounter and updated as appropriate:  Tobacco  Allergies  Meds  Problems  Med Hx  Surg Hx  Fam Hx      Review of Systems:  No other skin or systemic complaints except as noted in HPI or Assessment and Plan.  Objective  Well appearing patient in no apparent distress; mood and affect are within normal limits.  A focused examination was performed including face. Relevant physical exam findings are noted in the Assessment and Plan.  Objective  Right of midline upper lip: 0.5cm blanching pink macule  Objective  Cheeks: Dilated vessels with open and closed comedones.  Objective  face: White papules.   Assessment & Plan    Telangiectasia Right of midline upper lip  No evidence of cancer, faded from last visit  Rosacea Cheeks  Benign, observe.    Milia face  Benign, observe.   Actinic Damage - diffuse scaly erythematous macules with underlying dyspigmentation - Recommend daily broad spectrum sunscreen SPF 30+ to sun-exposed areas, reapply every 2 hours as needed.  - Call for new or changing lesions.  Return in about 6 months (around 01/11/2020) for TBSE.  Graciella Belton, RMA, am acting as scribe for Sarina Ser, MD . Documentation: I have reviewed the above documentation for accuracy and completeness, and I agree with the above.  Sarina Ser, MD

## 2019-07-12 NOTE — Patient Instructions (Signed)
Recommend daily broad spectrum sunscreen SPF 30+ to sun-exposed areas, reapply every 2 hours as needed. Call for new or changing lesions.  

## 2019-07-18 ENCOUNTER — Encounter: Payer: Self-pay | Admitting: Dermatology

## 2019-07-25 ENCOUNTER — Ambulatory Visit: Payer: PPO | Admitting: Dermatology

## 2019-07-25 ENCOUNTER — Other Ambulatory Visit: Payer: Self-pay

## 2019-07-25 DIAGNOSIS — L719 Rosacea, unspecified: Secondary | ICD-10-CM

## 2019-07-25 MED ORDER — DOXYCYCLINE HYCLATE 20 MG PO TABS: 40.0000 mg | ORAL_TABLET | Freq: Every day | ORAL | 1 refills | Status: DC

## 2019-07-25 MED ORDER — METRONIDAZOLE 0.75 % EX CREA: TOPICAL_CREAM | CUTANEOUS | 3 refills | Status: AC

## 2019-07-25 NOTE — Patient Instructions (Signed)
Rosacea  What is rosacea? Rosacea (say: ro-zay-sha) is a common skin disease that usually begins as a trend of flushing or blushing easily.  As rosacea progresses, a persistent redness in the center of the face will develop and may gradually spread beyond the nose and cheeks to the forehead and chin.  In some cases, the ears, chest, and back could be affected.  Rosacea may appear as tiny blood vessels or small red bumps that occur in crops.  Frequently they can contain pus, and are called "pustules".  If the bumps do not contain pus, they are referred to as "papules".  Rarely, in prolonged, untreated cases of rosacea, the oil glands of the nose and cheeks may become permanently enlarged.  This is called rhinophyma, and is seen more frequently in men.  Signs and Risks In its beginning stages, rosacea tends to come and go, which makes it difficult to recognize.  It can start as intermittent flushing of the face.  Eventually, blood vessels may become permanently visible.  Pustules and papules can appear, but can be mistaken for adult acne.  People of all races, ages, genders and ethnic groups are at risk of developing rosacea.  However, it is more common in women (especially around menopause) and adults with fair skin between the ages of 30 and 50.  Treatment Dermatologists typically recommend a combination of treatments to effectively manage rosacea.  Treatment can improve symptoms and may stop the progression of the rosacea.  Treatment may involve both topical and oral medications.  The tetracycline antibiotics are often used for their anti-inflammatory effect; however, because of the possibility of developing antibiotic resistance, they should not be used long term at full dose.  For dilated blood vessels the options include electrodessication (uses electric current through a small needle), laser treatment, and cosmetics to hide the redness.   With all forms of treatment, improvement is a slow process, and  patients may not see any results for the first 3-4 weeks.  It is very important to avoid the sun and other triggers.  Patients must wear sunscreen daily.  Skin Care Instructions: 1. Cleanse the skin with a mild soap such as CeraVe cleanser, Cetaphil cleanser, or Dove soap once or twice daily as needed. 2. Moisturize with Eucerin Redness Relief Daily Perfecting Lotion (has a subtle green tint), CeraVe Moisturizing Cream, or Oil of Olay Daily Moisturizer with sunscreen every morning and/or night as recommended. 3. Makeup should be "non-comedogenic" (won't clog pores) and be labeled "for sensitive skin". Good choices for cosmetics are: Neutrogena, Almay, and Physician's Formula.  Any product with a green tint tends to offset a red complexion. 4. If your eyes are dry and irritated, use artificial tears 2-3 times per day and cleanse the eyelids daily with baby shampoo.  Have your eyes examined at least every 2 years.  Be sure to tell your eye doctor that you have rosacea. 5. Alcoholic beverages tend to cause flushing of the skin, and may make rosacea worse. 6. Always wear sunscreen, protect your skin from extreme hot and cold temperatures, and avoid spicy foods, hot drinks, and mechanical irritation such as rubbing, scrubbing, or massaging the face.  Avoid harsh skin cleansers, cleansing masks, astringents, and exfoliation. If a particular product burns or makes your face feel tight, then it is likely to flare your rosacea. 7. If you are having difficulty finding a sunscreen that you can tolerate, you may try switching to a chemical-free sunscreen.  These are ones whose active   ingredient is zinc oxide or titanium dioxide only.  They should also be fragrance free, non-comedogenic, and labeled for sensitive skin. 8. Rosacea triggers may vary from person to person.  There are a variety of foods that have been reported to trigger rosacea.  Some patients find that keeping a diary of what they were doing when they  flared helps them avoid triggers.  

## 2019-07-25 NOTE — Progress Notes (Signed)
   Follow-Up Visit   Subjective  Barbara Waters is a 74 y.o. female who presents for the following: Rosacea (face, recent flare, no txt).  Came up after outside walking.   The following portions of the chart were reviewed this encounter and updated as appropriate:      Review of Systems:  No other skin or systemic complaints except as noted in HPI or Assessment and Plan.  Objective  Well appearing patient in no apparent distress; mood and affect are within normal limits.  A focused examination was performed including face. Relevant physical exam findings are noted in the Assessment and Plan.  Objective  face: Mid face erythema with telangiectasias +/- scattered inflammatory papules.    Assessment & Plan  Rosacea face  With recent flare  Start Metrocream qd/bid Start Doxycycline 20mg  2 po qd with food and drink Recommend SPF qd, Elta Replenish  Doxycycline should be taken with food to prevent nausea. Do not lay down for 30 minutes after taking. Be cautious with sun exposure and use good sun protection while on this medication. Pregnant women should not take this medication.    metroNIDAZOLE (METROCREAM) 0.75 % cream - face  doxycycline (PERIOSTAT) 20 MG tablet - face  Return for as scheduled.   Documentation: I have reviewed the above documentation for accuracy and completeness, and I agree with the above.  Brendolyn Patty MD

## 2019-09-10 ENCOUNTER — Other Ambulatory Visit: Payer: Self-pay | Admitting: Dermatology

## 2019-09-10 DIAGNOSIS — L719 Rosacea, unspecified: Secondary | ICD-10-CM

## 2019-10-23 DIAGNOSIS — E7800 Pure hypercholesterolemia, unspecified: Secondary | ICD-10-CM | POA: Diagnosis not present

## 2019-10-30 DIAGNOSIS — K219 Gastro-esophageal reflux disease without esophagitis: Secondary | ICD-10-CM | POA: Diagnosis not present

## 2019-10-30 DIAGNOSIS — F068 Other specified mental disorders due to known physiological condition: Secondary | ICD-10-CM | POA: Diagnosis not present

## 2019-10-30 DIAGNOSIS — E7800 Pure hypercholesterolemia, unspecified: Secondary | ICD-10-CM | POA: Diagnosis not present

## 2019-10-30 DIAGNOSIS — F0390 Unspecified dementia without behavioral disturbance: Secondary | ICD-10-CM | POA: Diagnosis not present

## 2019-12-01 ENCOUNTER — Other Ambulatory Visit: Payer: Self-pay | Admitting: Dermatology

## 2019-12-01 DIAGNOSIS — L719 Rosacea, unspecified: Secondary | ICD-10-CM

## 2019-12-24 DIAGNOSIS — J019 Acute sinusitis, unspecified: Secondary | ICD-10-CM | POA: Diagnosis not present

## 2019-12-24 DIAGNOSIS — Z03818 Encounter for observation for suspected exposure to other biological agents ruled out: Secondary | ICD-10-CM | POA: Diagnosis not present

## 2019-12-24 DIAGNOSIS — J40 Bronchitis, not specified as acute or chronic: Secondary | ICD-10-CM | POA: Diagnosis not present

## 2020-01-24 ENCOUNTER — Other Ambulatory Visit: Payer: Self-pay

## 2020-01-24 ENCOUNTER — Ambulatory Visit: Payer: PPO | Admitting: Dermatology

## 2020-01-24 ENCOUNTER — Encounter: Payer: Self-pay | Admitting: Dermatology

## 2020-01-24 DIAGNOSIS — L821 Other seborrheic keratosis: Secondary | ICD-10-CM

## 2020-01-24 DIAGNOSIS — F0390 Unspecified dementia without behavioral disturbance: Secondary | ICD-10-CM | POA: Diagnosis not present

## 2020-01-24 DIAGNOSIS — Z85828 Personal history of other malignant neoplasm of skin: Secondary | ICD-10-CM

## 2020-01-24 DIAGNOSIS — D229 Melanocytic nevi, unspecified: Secondary | ICD-10-CM

## 2020-01-24 DIAGNOSIS — L719 Rosacea, unspecified: Secondary | ICD-10-CM

## 2020-01-24 DIAGNOSIS — D1800 Hemangioma unspecified site: Secondary | ICD-10-CM | POA: Diagnosis not present

## 2020-01-24 DIAGNOSIS — I8310 Varicose veins of unspecified lower extremity with inflammation: Secondary | ICD-10-CM | POA: Diagnosis not present

## 2020-01-24 DIAGNOSIS — L814 Other melanin hyperpigmentation: Secondary | ICD-10-CM | POA: Diagnosis not present

## 2020-01-24 DIAGNOSIS — L578 Other skin changes due to chronic exposure to nonionizing radiation: Secondary | ICD-10-CM

## 2020-01-24 DIAGNOSIS — Z1283 Encounter for screening for malignant neoplasm of skin: Secondary | ICD-10-CM

## 2020-01-24 DIAGNOSIS — L918 Other hypertrophic disorders of the skin: Secondary | ICD-10-CM

## 2020-01-24 DIAGNOSIS — E538 Deficiency of other specified B group vitamins: Secondary | ICD-10-CM | POA: Diagnosis not present

## 2020-01-24 DIAGNOSIS — F068 Other specified mental disorders due to known physiological condition: Secondary | ICD-10-CM | POA: Diagnosis not present

## 2020-01-24 DIAGNOSIS — R2689 Other abnormalities of gait and mobility: Secondary | ICD-10-CM | POA: Diagnosis not present

## 2020-01-24 NOTE — Patient Instructions (Signed)
Instructions for [facility] Medications  One or more of your medications was sent to the [facility] mail order compounding pharmacy. You will receive an email from them and can purchase the medicine through that link. It will then be mailed to your home at the address you confirmed. If for any reason you do not receive an email from them, please check your spam folder. If you still do not find the email, please let us know. [facility] phone number is [phone].   

## 2020-01-24 NOTE — Progress Notes (Signed)
Follow-Up Visit   Subjective  Barbara Waters is a 75 y.o. female who presents for the following: Total body skin exam (Check moles all over, hx of BCC) and Rosacea (Face, Doxycycline 20mg  2 po qd, Metrocream qd).  Patient accompanied by husband who contributes to history.  The following portions of the chart were reviewed this encounter and updated as appropriate:   Tobacco  Allergies  Meds  Problems  Med Hx  Surg Hx  Fam Hx     Review of Systems:  No other skin or systemic complaints except as noted in HPI or Assessment and Plan.  Objective  Well appearing patient in no apparent distress; mood and affect are within normal limits.  A full examination was performed including scalp, head, eyes, ears, nose, lips, neck, chest, axillae, abdomen, back, buttocks, bilateral upper extremities, bilateral lower extremities, hands, feet, fingers, toes, fingernails, and toenails. All findings within normal limits unless otherwise noted below.  Objective  face: Pinkness but no paps   Assessment & Plan  Rosacea face Rosacea is a chronic progressive skin condition usually affecting the face of adults, causing redness and/or acne bumps. It is treatable but not curable. It sometimes affects the eyes (ocular rosacea) as well. It may respond to topical and/or systemic medication and can flare with stress, sun exposure, alcohol, exercise and some foods.  Daily application of broad spectrum spf 30+ sunscreen to face is recommended to reduce flares.  D/C Doxycycline 40mg , may restart prn flares D/C Metrocream and  Start [facility] Triple cream  Will prescribe [facility] metronidazole/ivermectin/azelaic acid twice daily as needed to affected areas on the face. The patient was advised this is not covered by insurance since it is made by a compounding pharmacy. They will receive an email to check out and the medication will be mailed to their home.    Other Related  Medications metroNIDAZOLE (METROCREAM) 0.75 % cream   History of Basal Cell Carcinoma of the Skin - No evidence of recurrence today - Recommend regular full body skin exams - Recommend daily broad spectrum sunscreen SPF 30+ to sun-exposed areas, reapply every 2 hours as needed.  - Call if any new or changing lesions are noted between office visits  Lentigines - Scattered tan macules - Discussed due to sun exposure - Benign, observe - Call for any changes  Seborrheic Keratoses - Stuck-on, waxy, tan-brown papules and plaques  - Discussed benign etiology and prognosis. - Observe - Call for any changes  Melanocytic Nevi - Tan-brown and/or pink-flesh-colored symmetric macules and papules - Benign appearing on exam today - Observation - Call clinic for new or changing moles - Recommend daily use of broad spectrum spf 30+ sunscreen to sun-exposed areas.   Hemangiomas - Red papules - Discussed benign nature - Observe - Call for any changes  Actinic Damage - Chronic, secondary to cumulative UV/sun exposure - diffuse scaly erythematous macules with underlying dyspigmentation - Recommend daily broad spectrum sunscreen SPF 30+ to sun-exposed areas, reapply every 2 hours as needed.  - Call for new or changing lesions.  Skin cancer screening performed today.  Acrochordons (Skin Tags) - Fleshy, skin-colored pedunculated papules - Benign appearing.  - Observe. - If desired, they can be removed with an in office procedure that is not covered by insurance. - Please call the clinic if you notice any new or changing lesions.  Varicose Veins - Dilated blue, purple or red veins at the lower extremities - Reassured - These can be  treated by sclerotherapy (a procedure to inject a medicine into the veins to make them disappear) if desired, but the treatment is not covered by insurance   Return in about 6 months (around 07/23/2020) for sun exposed areas hx of BCC.   I, Ardis Rowan, RMA,  am acting as scribe for Armida Sans, MD .  Documentation: I have reviewed the above documentation for accuracy and completeness, and I agree with the above.  Armida Sans, MD

## 2020-01-25 ENCOUNTER — Encounter: Payer: Self-pay | Admitting: Dermatology

## 2020-03-19 DIAGNOSIS — H11122 Conjunctival concretions, left eye: Secondary | ICD-10-CM | POA: Diagnosis not present

## 2020-04-22 DIAGNOSIS — E7800 Pure hypercholesterolemia, unspecified: Secondary | ICD-10-CM | POA: Diagnosis not present

## 2020-04-29 DIAGNOSIS — K219 Gastro-esophageal reflux disease without esophagitis: Secondary | ICD-10-CM | POA: Diagnosis not present

## 2020-04-29 DIAGNOSIS — F0390 Unspecified dementia without behavioral disturbance: Secondary | ICD-10-CM | POA: Diagnosis not present

## 2020-04-29 DIAGNOSIS — F068 Other specified mental disorders due to known physiological condition: Secondary | ICD-10-CM | POA: Diagnosis not present

## 2020-04-29 DIAGNOSIS — E538 Deficiency of other specified B group vitamins: Secondary | ICD-10-CM | POA: Diagnosis not present

## 2020-04-29 DIAGNOSIS — M810 Age-related osteoporosis without current pathological fracture: Secondary | ICD-10-CM | POA: Diagnosis not present

## 2020-04-29 DIAGNOSIS — Z0000 Encounter for general adult medical examination without abnormal findings: Secondary | ICD-10-CM | POA: Diagnosis not present

## 2020-04-29 DIAGNOSIS — E7800 Pure hypercholesterolemia, unspecified: Secondary | ICD-10-CM | POA: Diagnosis not present

## 2020-06-04 IMAGING — MG DIGITAL SCREENING BILATERAL MAMMOGRAM WITH TOMO AND CAD
1 series · 1 of 1 positions shown · non-contrast
Comparison: Previous exam(s).

CLINICAL DATA: Screening.

EXAM:
DIGITAL SCREENING BILATERAL MAMMOGRAM WITH TOMO AND CAD

[L MLO synth-2D]
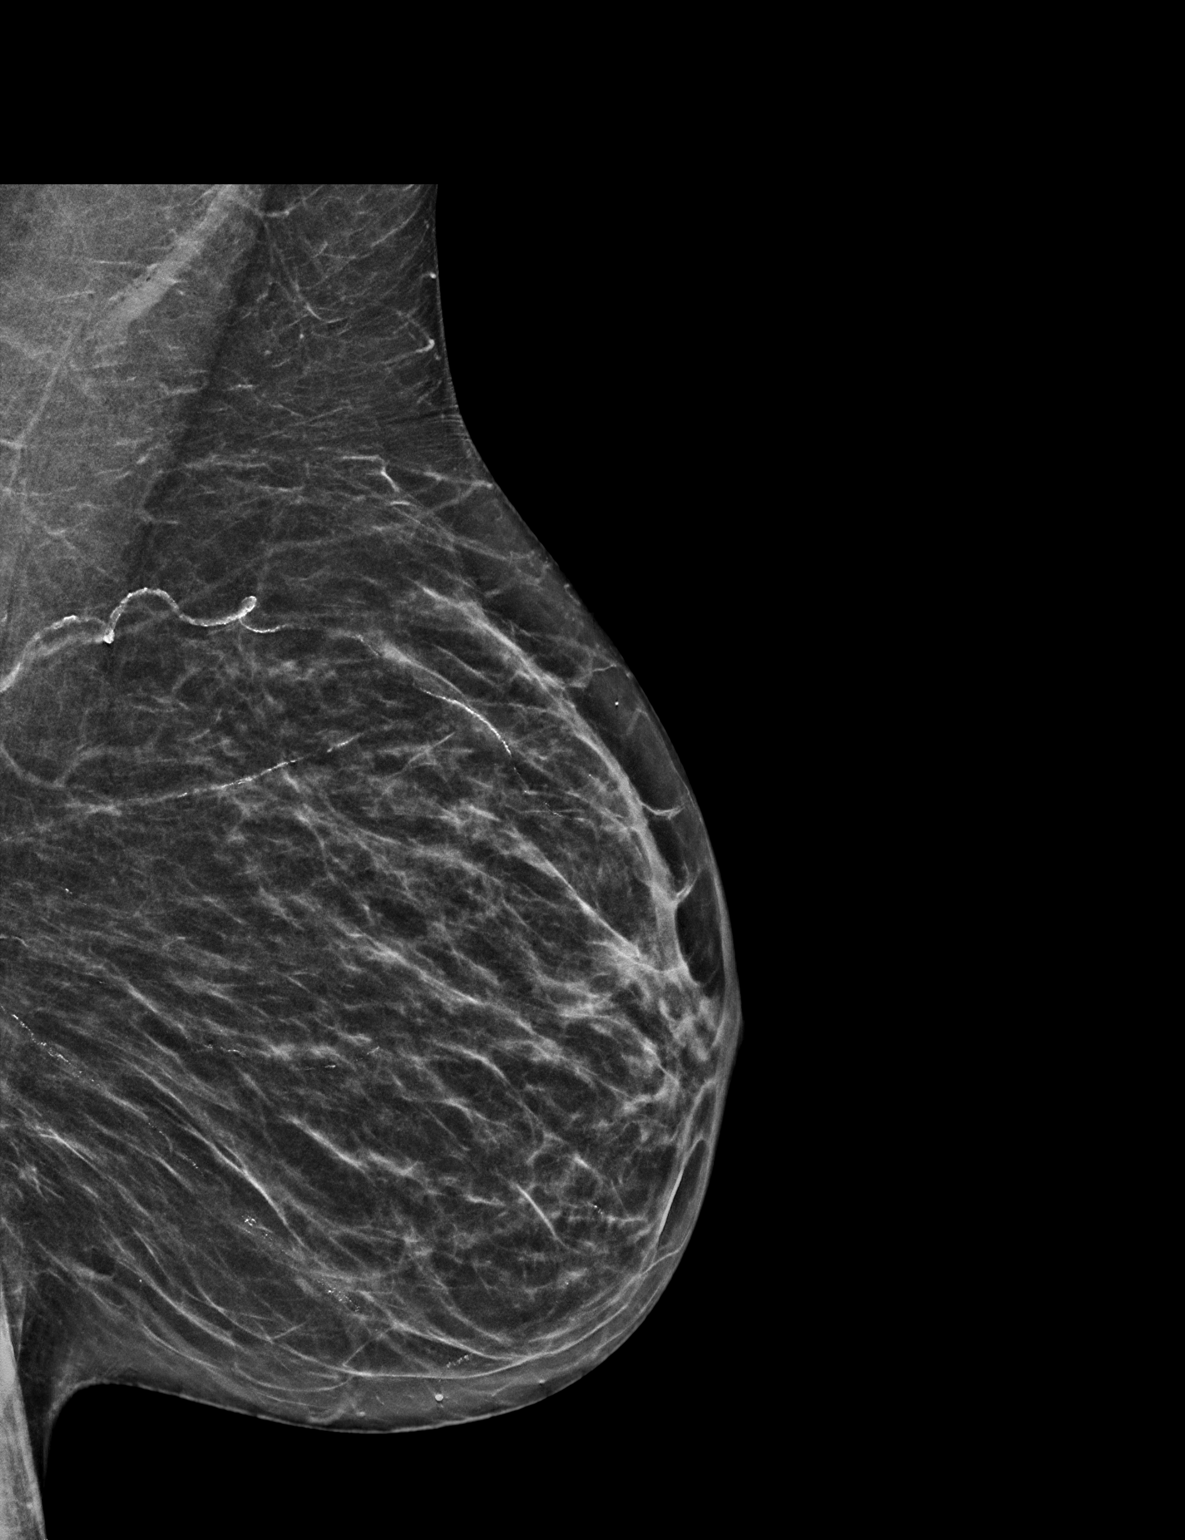

[1 of 1 positions shown; findings below may reference images not displayed]

ACR Breast Density Category b: There are scattered areas of
fibroglandular density.
FINDINGS: There are no findings suspicious for malignancy. Images were
processed with CAD.
IMPRESSION: No mammographic evidence of malignancy. A result letter of this
screening mammogram will be mailed directly to the patient.

RECOMMENDATION:
Screening mammogram in one year. (Code:[id number])

BI-RADS CATEGORY  1: Negative.

## 2020-06-23 DIAGNOSIS — Z961 Presence of intraocular lens: Secondary | ICD-10-CM | POA: Diagnosis not present

## 2020-07-09 DIAGNOSIS — Z20822 Contact with and (suspected) exposure to covid-19: Secondary | ICD-10-CM | POA: Diagnosis not present

## 2020-07-09 DIAGNOSIS — Z03818 Encounter for observation for suspected exposure to other biological agents ruled out: Secondary | ICD-10-CM | POA: Diagnosis not present

## 2020-07-29 DIAGNOSIS — R2689 Other abnormalities of gait and mobility: Secondary | ICD-10-CM | POA: Diagnosis not present

## 2020-07-29 DIAGNOSIS — J3489 Other specified disorders of nose and nasal sinuses: Secondary | ICD-10-CM | POA: Diagnosis not present

## 2020-07-29 DIAGNOSIS — F0390 Unspecified dementia without behavioral disturbance: Secondary | ICD-10-CM | POA: Diagnosis not present

## 2020-07-29 DIAGNOSIS — F068 Other specified mental disorders due to known physiological condition: Secondary | ICD-10-CM | POA: Diagnosis not present

## 2020-07-31 ENCOUNTER — Ambulatory Visit: Payer: PPO | Admitting: Dermatology

## 2020-08-04 ENCOUNTER — Ambulatory Visit: Payer: PPO | Admitting: Dermatology

## 2020-08-04 ENCOUNTER — Other Ambulatory Visit: Payer: Self-pay

## 2020-08-04 DIAGNOSIS — L570 Actinic keratosis: Secondary | ICD-10-CM | POA: Diagnosis not present

## 2020-08-04 DIAGNOSIS — Z1283 Encounter for screening for malignant neoplasm of skin: Secondary | ICD-10-CM | POA: Diagnosis not present

## 2020-08-04 DIAGNOSIS — L719 Rosacea, unspecified: Secondary | ICD-10-CM | POA: Diagnosis not present

## 2020-08-04 DIAGNOSIS — L814 Other melanin hyperpigmentation: Secondary | ICD-10-CM | POA: Diagnosis not present

## 2020-08-04 DIAGNOSIS — L578 Other skin changes due to chronic exposure to nonionizing radiation: Secondary | ICD-10-CM

## 2020-08-04 DIAGNOSIS — Z85828 Personal history of other malignant neoplasm of skin: Secondary | ICD-10-CM | POA: Diagnosis not present

## 2020-08-04 DIAGNOSIS — L821 Other seborrheic keratosis: Secondary | ICD-10-CM | POA: Diagnosis not present

## 2020-08-04 NOTE — Patient Instructions (Addendum)
Actinic keratoses are precancerous spots that appear secondary to cumulative UV radiation exposure/sun exposure over time. They are chronic with expected duration over 1 year. A portion of actinic keratoses will progress to squamous cell carcinoma of the skin. It is not possible to reliably predict which spots will progress to skin cancer and so treatment is recommended to prevent development of skin cancer.  Recommend daily broad spectrum sunscreen SPF 30+ to sun-exposed areas, reapply every 2 hours as needed.  Recommend staying in the shade or wearing long sleeves, sun glasses (UVA+UVB protection) and wide brim hats (4-inch brim around the entire circumference of the hat). Call for new or changing lesions.   Cryotherapy Aftercare  Wash gently with soap and water everyday.   Apply Vaseline and Band-Aid daily until healed.   Melanoma ABCDEs  Melanoma is the most dangerous type of skin cancer, and is the leading cause of death from skin disease.  You are more likely to develop melanoma if you: Have light-colored skin, light-colored eyes, or red or blond hair Spend a lot of time in the sun Tan regularly, either outdoors or in a tanning bed Have had blistering sunburns, especially during childhood Have a close family member who has had a melanoma Have atypical moles or large birthmarks  Early detection of melanoma is key since treatment is typically straightforward and cure rates are extremely high if we catch it early.   The first sign of melanoma is often a change in a mole or a new dark spot.  The ABCDE system is a way of remembering the signs of melanoma.  A for asymmetry:  The two halves do not match. B for border:  The edges of the growth are irregular. C for color:  A mixture of colors are present instead of an even brown color. D for diameter:  Melanomas are usually (but not always) greater than 45mm - the size of a pencil eraser. E for evolution:  The spot keeps changing in size,  shape, and color.  Please check your skin once per month between visits. You can use a small mirror in front and a large mirror behind you to keep an eye on the back side or your body.   If you see any new or changing lesions before your next follow-up, please call to schedule a visit.  Please continue daily skin protection including broad spectrum sunscreen SPF 30+ to sun-exposed areas, reapplying every 2 hours as needed when you're outdoors.   Staying in the shade or wearing long sleeves, sun glasses (UVA+UVB protection) and wide brim hats (4-inch brim around the entire circumference of the hat) are also recommended for sun protection.    If you have any questions or concerns for your doctor, please call our main line at [phone] and press option 4 to reach your doctor's medical assistant. If no one answers, please leave a voicemail as directed and we will return your call as soon as possible. Messages left after 4 pm will be answered the following business day.   You may also send [place] a message via [place]. We typically respond to MyChart messages within 1-2 business days.  For prescription refills, please ask your pharmacy to contact our office. Our fax number is [phone].  If you have an urgent issue when the clinic is closed that cannot wait until the next business day, you can page your doctor at the number below.    Please note that while we do our best to be  available for urgent issues outside of office hours, we are not available 24/7.   If you have an urgent issue and are unable to reach [place], you may choose to seek medical care at your doctor's office, retail clinic, urgent care center, or emergency room.  If you have a medical emergency, please immediately call 911 or go to the emergency department.  Pager Numbers  - Dr. Nehemiah Massed: [phone]  - Dr. Laurence Ferrari: [phone]  - Dr. Nicole Kindred: [phone]  In the event of inclement weather, please call our main line at  [phone] for an update on the status of any delays or closures.  Dermatology Medication Tips: Please keep the boxes that topical medications come in in order to help keep track of the instructions about where and how to use these. Pharmacies typically print the medication instructions only on the boxes and not directly on the medication tubes.   If your medication is too expensive, please contact our office at [phone] option 4 or send [place] a message through [place].   We are unable to tell what your co-pay for medications will be in advance as this is different depending on your insurance coverage. However, we may be able to find a substitute medication at lower cost or fill out paperwork to get insurance to cover a needed medication.   If a prior authorization is required to get your medication covered by your insurance company, please allow [place] 1-2 business days to complete this process.  Drug prices often vary depending on where the prescription is filled and some pharmacies may offer cheaper prices.  The website www.goodrx.com contains coupons for medications through different pharmacies. The prices here do not account for what the cost may be with help from insurance (it may be cheaper with your insurance), but the website can give you the price if you did not use any insurance.  - You can print the associated coupon and take it with your prescription to the pharmacy.  - You may also stop by our office during regular business hours and pick up a GoodRx coupon card.  - If you need your prescription sent electronically to a different pharmacy, notify our office through [facility] or by phone at [phone] option 4.

## 2020-08-04 NOTE — Progress Notes (Signed)
Follow-Up Visit   Subjective  Barbara Waters is a 75 y.o. female who presents for the following: Follow-up (6 month follow up on sun exposed areas and history of bcc. ).  The following portions of the chart were reviewed this encounter and updated as appropriate:  Tobacco  Allergies  Meds  Problems  Med Hx  Surg Hx  Fam Hx      Objective  Well appearing patient in no apparent distress; mood and affect are within normal limits.  All sun exposed areas plus back examined.  forehead x 2 (2) Erythematous thin papules/macules with gritty scale.   cheeks and midface Pinkness at cheeks but no papules   Assessment & Plan   Seborrheic Keratoses - Stuck-on, waxy, tan-brown papules and/or plaques  - Benign-appearing - Discussed benign etiology and prognosis. - Observe - Call for any changes  Lentigines - Scattered tan macules - Due to sun exposure - Benign-appering, observe - Recommend daily broad spectrum sunscreen SPF 30+ to sun-exposed areas, reapply every 2 hours as needed. - Call for any changes  Actinic Damage - chronic, secondary to cumulative UV radiation exposure/sun exposure over time - diffuse scaly erythematous macules with underlying dyspigmentation - Recommend daily broad spectrum sunscreen SPF 30+ to sun-exposed areas, reapply every 2 hours as needed.  - Recommend staying in the shade or wearing long sleeves, sun glasses (UVA+UVB protection) and wide brim hats (4-inch brim around the entire circumference of the hat). - Call for new or changing lesions.  Actinic keratosis (2) forehead x 2  Actinic keratoses are precancerous spots that appear secondary to cumulative UV radiation exposure/sun exposure over time. They are chronic with expected duration over 1 year. A portion of actinic keratoses will progress to squamous cell carcinoma of the skin. It is not possible to reliably predict which spots will progress to skin cancer and so treatment is recommended to  prevent development of skin cancer.  Recommend daily broad spectrum sunscreen SPF 30+ to sun-exposed areas, reapply every 2 hours as needed.  Recommend staying in the shade or wearing long sleeves, sun glasses (UVA+UVB protection) and wide brim hats (4-inch brim around the entire circumference of the hat). Call for new or changing lesions.  Destruction of lesion - forehead x 2 Complexity: simple   Destruction method: cryotherapy   Informed consent: discussed and consent obtained   Timeout:  patient name, date of birth, surgical site, and procedure verified Lesion destroyed using liquid nitrogen: Yes   Region frozen until ice ball extended beyond lesion: Yes   Outcome: patient tolerated procedure well with no complications   Post-procedure details: wound care instructions given    Rosacea cheeks and midface Improving. Not to goal. Rosacea is a chronic progressive skin condition usually affecting the face of adults, causing redness and/or acne bumps. It is treatable but not curable. It sometimes affects the eyes (ocular rosacea) as well. It may respond to topical and/or systemic medication and can flare with stress, sun exposure, alcohol, exercise and some foods.  Daily application of broad spectrum spf 30+ sunscreen to face is recommended to reduce flares.  Continue skin medicinals triple cream as directed.   Skin cancer screening  History of Basal Cell Carcinoma of the Skin - No evidence of recurrence today at left cheek (2018) midline superior forehead near hairline (2020) left upper lip (2020) - Recommend regular full body skin exams - Recommend daily broad spectrum sunscreen SPF 30+ to sun-exposed areas, reapply every 2 hours as needed.  -  Call if any new or changing lesions are noted between office visits  Return in about 6 months (around 02/04/2021) for sun exposed areas history of bcc . IRuthell Rummage, CMA, am acting as scribe for Sarina Ser, MD.  Documentation: I have  reviewed the above documentation for accuracy and completeness, and I agree with the above.  Sarina Ser, MD

## 2020-08-06 ENCOUNTER — Encounter: Payer: Self-pay | Admitting: Dermatology

## 2020-09-01 DIAGNOSIS — L601 Onycholysis: Secondary | ICD-10-CM | POA: Diagnosis not present

## 2020-09-01 DIAGNOSIS — L03032 Cellulitis of left toe: Secondary | ICD-10-CM | POA: Diagnosis not present

## 2020-10-22 DIAGNOSIS — E7800 Pure hypercholesterolemia, unspecified: Secondary | ICD-10-CM | POA: Diagnosis not present

## 2020-10-29 DIAGNOSIS — E7800 Pure hypercholesterolemia, unspecified: Secondary | ICD-10-CM | POA: Diagnosis not present

## 2020-10-29 DIAGNOSIS — R197 Diarrhea, unspecified: Secondary | ICD-10-CM | POA: Diagnosis not present

## 2020-10-29 DIAGNOSIS — F03A0 Unspecified dementia, mild, without behavioral disturbance, psychotic disturbance, mood disturbance, and anxiety: Secondary | ICD-10-CM | POA: Diagnosis not present

## 2020-10-29 DIAGNOSIS — F068 Other specified mental disorders due to known physiological condition: Secondary | ICD-10-CM | POA: Diagnosis not present

## 2020-10-29 DIAGNOSIS — K219 Gastro-esophageal reflux disease without esophagitis: Secondary | ICD-10-CM | POA: Diagnosis not present

## 2020-10-29 DIAGNOSIS — E538 Deficiency of other specified B group vitamins: Secondary | ICD-10-CM | POA: Diagnosis not present

## 2020-10-29 DIAGNOSIS — Z0001 Encounter for general adult medical examination with abnormal findings: Secondary | ICD-10-CM | POA: Diagnosis not present

## 2020-12-01 DIAGNOSIS — R197 Diarrhea, unspecified: Secondary | ICD-10-CM | POA: Diagnosis not present

## 2020-12-16 DIAGNOSIS — M25532 Pain in left wrist: Secondary | ICD-10-CM | POA: Diagnosis not present

## 2020-12-16 DIAGNOSIS — R2232 Localized swelling, mass and lump, left upper limb: Secondary | ICD-10-CM | POA: Diagnosis not present

## 2020-12-16 DIAGNOSIS — M25432 Effusion, left wrist: Secondary | ICD-10-CM | POA: Diagnosis not present

## 2020-12-16 DIAGNOSIS — S63502A Unspecified sprain of left wrist, initial encounter: Secondary | ICD-10-CM | POA: Diagnosis not present

## 2020-12-22 DIAGNOSIS — J301 Allergic rhinitis due to pollen: Secondary | ICD-10-CM | POA: Diagnosis not present

## 2020-12-22 DIAGNOSIS — J310 Chronic rhinitis: Secondary | ICD-10-CM | POA: Diagnosis not present

## 2020-12-25 DIAGNOSIS — M25432 Effusion, left wrist: Secondary | ICD-10-CM | POA: Diagnosis not present

## 2020-12-25 DIAGNOSIS — S63502A Unspecified sprain of left wrist, initial encounter: Secondary | ICD-10-CM | POA: Diagnosis not present

## 2020-12-25 DIAGNOSIS — R2232 Localized swelling, mass and lump, left upper limb: Secondary | ICD-10-CM | POA: Diagnosis not present

## 2021-01-15 DIAGNOSIS — R2232 Localized swelling, mass and lump, left upper limb: Secondary | ICD-10-CM | POA: Diagnosis not present

## 2021-01-15 DIAGNOSIS — S63502A Unspecified sprain of left wrist, initial encounter: Secondary | ICD-10-CM | POA: Diagnosis not present

## 2021-01-15 DIAGNOSIS — M25432 Effusion, left wrist: Secondary | ICD-10-CM | POA: Diagnosis not present

## 2021-01-15 DIAGNOSIS — M25532 Pain in left wrist: Secondary | ICD-10-CM | POA: Diagnosis not present

## 2021-01-29 DIAGNOSIS — J3489 Other specified disorders of nose and nasal sinuses: Secondary | ICD-10-CM | POA: Diagnosis not present

## 2021-01-29 DIAGNOSIS — R413 Other amnesia: Secondary | ICD-10-CM | POA: Diagnosis not present

## 2021-01-29 DIAGNOSIS — R2689 Other abnormalities of gait and mobility: Secondary | ICD-10-CM | POA: Diagnosis not present

## 2021-01-29 DIAGNOSIS — F411 Generalized anxiety disorder: Secondary | ICD-10-CM | POA: Diagnosis not present

## 2021-02-04 ENCOUNTER — Other Ambulatory Visit: Payer: Self-pay

## 2021-02-04 ENCOUNTER — Ambulatory Visit: Payer: PPO | Admitting: Dermatology

## 2021-02-04 DIAGNOSIS — L578 Other skin changes due to chronic exposure to nonionizing radiation: Secondary | ICD-10-CM

## 2021-02-04 DIAGNOSIS — C44319 Basal cell carcinoma of skin of other parts of face: Secondary | ICD-10-CM | POA: Diagnosis not present

## 2021-02-04 DIAGNOSIS — Z85828 Personal history of other malignant neoplasm of skin: Secondary | ICD-10-CM | POA: Diagnosis not present

## 2021-02-04 DIAGNOSIS — C4401 Basal cell carcinoma of skin of lip: Secondary | ICD-10-CM | POA: Diagnosis not present

## 2021-02-04 DIAGNOSIS — L821 Other seborrheic keratosis: Secondary | ICD-10-CM | POA: Diagnosis not present

## 2021-02-04 DIAGNOSIS — L570 Actinic keratosis: Secondary | ICD-10-CM

## 2021-02-04 DIAGNOSIS — D485 Neoplasm of uncertain behavior of skin: Secondary | ICD-10-CM

## 2021-02-04 DIAGNOSIS — C4491 Basal cell carcinoma of skin, unspecified: Secondary | ICD-10-CM

## 2021-02-04 HISTORY — DX: Basal cell carcinoma of skin, unspecified: C44.91

## 2021-02-04 NOTE — Progress Notes (Signed)
Follow-Up Visit   Subjective  Barbara Waters is a 76 y.o. female who presents for the following: Actinic Keratosis (6 month follow up of forehead treated with LN2). The patient has spots, moles and lesions to be evaluated, some may be new or changing and the patient has concerns that these could be cancer.  Accompanied by husband who contributes to history.  The following portions of the chart were reviewed this encounter and updated as appropriate:   Tobacco   Allergies   Meds   Problems   Med Hx   Surg Hx   Fam Hx      Review of Systems:  No other skin or systemic complaints except as noted in HPI or Assessment and Plan.  Objective  Well appearing patient in no apparent distress; mood and affect are within normal limits.  A focused examination was performed including face. Relevant physical exam findings are noted in the Assessment and Plan.  Left upper lip 0.6 cm crusted papule       Right cheek Erythematous thin papules/macules with gritty scale.    Assessment & Plan   History of Basal Cell Carcinoma of the Skin - No evidence of recurrence today - Recommend regular full body skin exams - Recommend daily broad spectrum sunscreen SPF 30+ to sun-exposed areas, reapply every 2 hours as needed.  - Call if any new or changing lesions are noted between office visits  Actinic Damage - chronic, secondary to cumulative UV radiation exposure/sun exposure over time - diffuse scaly erythematous macules with underlying dyspigmentation - Recommend daily broad spectrum sunscreen SPF 30+ to sun-exposed areas, reapply every 2 hours as needed.  - Recommend staying in the shade or wearing long sleeves, sun glasses (UVA+UVB protection) and wide brim hats (4-inch brim around the entire circumference of the hat). - Call for new or changing lesions.  Seborrheic Keratoses - Stuck-on, waxy, tan-brown papules and/or plaques  - Benign-appearing - Discussed benign etiology and prognosis. -  Observe - Call for any changes  Neoplasm of uncertain behavior of skin Left upper lip  Epidermal / dermal shaving  Lesion diameter (cm):  0.6 Informed consent: discussed and consent obtained   Timeout: patient name, date of birth, surgical site, and procedure verified   Procedure prep:  Patient was prepped and draped in usual sterile fashion Prep type:  Isopropyl alcohol Anesthesia: the lesion was anesthetized in a standard fashion   Anesthetic:  1% lidocaine w/ epinephrine 1-100,000 buffered w/ 8.4% NaHCO3 Instrument used: flexible razor blade   Hemostasis achieved with: pressure, aluminum chloride and electrodesiccation   Outcome: patient tolerated procedure well   Post-procedure details: sterile dressing applied and wound care instructions given   Dressing type: bandage and petrolatum    Destruction of lesion Complexity: extensive   Destruction method: electrodesiccation and curettage   Informed consent: discussed and consent obtained   Timeout:  patient name, date of birth, surgical site, and procedure verified Procedure prep:  Patient was prepped and draped in usual sterile fashion Prep type:  Isopropyl alcohol Anesthesia: the lesion was anesthetized in a standard fashion   Anesthetic:  1% lidocaine w/ epinephrine 1-100,000 buffered w/ 8.4% NaHCO3 Curettage performed in three different directions: Yes   Electrodesiccation performed over the curetted area: Yes   Lesion length (cm):  0.6 Lesion width (cm):  0.6 Margin per side (cm):  0.2 Final wound size (cm):  1 Hemostasis achieved with:  pressure and aluminum chloride Outcome: patient tolerated procedure well with no  complications   Post-procedure details: sterile dressing applied and wound care instructions given   Dressing type: bandage and petrolatum    Specimen 1 - Surgical pathology Differential Diagnosis: BCC vs other  Check Margins: No EDC today  AK (actinic keratosis) Right cheek  Destruction of lesion -  Right cheek Complexity: simple   Destruction method: cryotherapy   Informed consent: discussed and consent obtained   Timeout:  patient name, date of birth, surgical site, and procedure verified Lesion destroyed using liquid nitrogen: Yes   Region frozen until ice ball extended beyond lesion: Yes   Outcome: patient tolerated procedure well with no complications   Post-procedure details: wound care instructions given     Return in about 6 months (around 08/04/2021).  I, Ashok Cordia, CMA, am acting as scribe for Sarina Ser, MD . Documentation: I have reviewed the above documentation for accuracy and completeness, and I agree with the above.  Sarina Ser, MD

## 2021-02-04 NOTE — Patient Instructions (Signed)
Wound Care Instructions  Cleanse wound gently with soap and water once a day then pat dry with clean gauze. Apply a thing coat of Petrolatum (petroleum jelly, "Vaseline") over the wound (unless you have an allergy to this). We recommend that you use a new, sterile tube of Vaseline. Do not pick or remove scabs. Do not remove the yellow or white "healing tissue" from the base of the wound.  Cover the wound with fresh, clean, nonstick gauze and secure with paper tape. You may use Band-Aids in place of gauze and tape if the would is small enough, but would recommend trimming much of the tape off as there is often too much. Sometimes Band-Aids can irritate the skin.  You should call the office for your biopsy report after 1 week if you have not already been contacted.  If you experience any problems, such as abnormal amounts of bleeding, swelling, significant bruising, significant pain, or evidence of infection, please call the office immediately.  FOR ADULT SURGERY PATIENTS: If you need something for pain relief you may take 1 extra strength Tylenol (acetaminophen) AND 2 Ibuprofen (200mg  each) together every 4 hours as needed for pain. (do not take these if you are allergic to them or if you have a reason you should not take them.) Typically, you may only need pain medication for 1 to 3 days.      Cryotherapy Aftercare  Wash gently with soap and water everyday.   Apply Vaseline and Band-Aid daily until healed.     If You Need Anything After Your Visit  If you have any questions or concerns for your doctor, please call our main line at [phone] and press option 4 to reach your doctor's medical assistant. If no one answers, please leave a voicemail as directed and we will return your call as soon as possible. Messages left after 4 pm will be answered the following business day.   You may also send [place] a message via [place]. We typically respond to MyChart messages within 1-2 business  days.  For prescription refills, please ask your pharmacy to contact our office. Our fax number is [phone].  If you have an urgent issue when the clinic is closed that cannot wait until the next business day, you can page your doctor at the number below.    Please note that while we do our best to be available for urgent issues outside of office hours, we are not available 24/7.   If you have an urgent issue and are unable to reach [place], you may choose to seek medical care at your doctor's office, retail clinic, urgent care center, or emergency room.  If you have a medical emergency, please immediately call 911 or go to the emergency department.  Pager Numbers  - Dr. Nehemiah Massed: [phone]  - Dr. Laurence Ferrari: [phone]  - Dr. Nicole Kindred: [phone]  In the event of inclement weather, please call our main line at [phone] for an update on the status of any delays or closures.  Dermatology Medication Tips: Please keep the boxes that topical medications come in in order to help keep track of the instructions about where and how to use these. Pharmacies typically print the medication instructions only on the boxes and not directly on the medication tubes.   If your medication is too expensive, please contact our office at [phone] option 4 or send [place] a message through [place].   We are unable to tell what your co-pay for medications will be in  advance as this is different depending on your insurance coverage. However, we may be able to find a substitute medication at lower cost or fill out paperwork to get insurance to cover a needed medication.   If a prior authorization is required to get your medication covered by your insurance company, please allow [place] 1-2 business days to complete this process.  Drug prices often vary depending on where the prescription is filled and some pharmacies may offer cheaper prices.  The website www.goodrx.com contains coupons for medications through  different pharmacies. The prices here do not account for what the cost may be with help from insurance (it may be cheaper with your insurance), but the website can give you the price if you did not use any insurance.  - You can print the associated coupon and take it with your prescription to the pharmacy.  - You may also stop by our office during regular business hours and pick up a GoodRx coupon card.  - If you need your prescription sent electronically to a different pharmacy, notify our office through [facility] or by phone at [phone] option 4.     Si Usted Necesita Algo Despus de Su Visita  Tambin puede enviarnos un mensaje a travs de Pharmacist, community. Por lo general respondemos a los mensajes de MyChart en el transcurso de 1 a 2 das hbiles.  Para renovar recetas, por favor pida a su farmacia que se ponga en contacto con nuestra oficina. Harland Dingwall de fax es [place] [phone].  Si tiene un asunto urgente cuando la clnica est cerrada y que no puede esperar hasta el siguiente da hbil, puede llamar/localizar a su doctor(a) al nmero que aparece a continuacin.   Por favor, tenga en cuenta que aunque hacemos todo lo posible para estar disponibles para asuntos urgentes fuera del horario de [place], no estamos disponibles las 24 horas del da, los 7 das de la [place].   Si tiene un problema urgente y no puede comunicarse con nosotros, puede optar por buscar atencin mdica  en el consultorio de su doctor(a), en una clnica privada, en un centro de atencin urgente o en una sala de emergencias.  Si tiene Engineering geologist, por favor llame inmediatamente al 911 o vaya a la sala de emergencias.  Nmeros de bper  - Dr. Nehemiah Massed: [phone]  - Dra. Moye: [phone]  - Dra. Nicole Kindred: [phone]  En caso de inclemencias del [place], por favor llame a Johnsie Kindred principal al [phone] para una actualizacin sobre el [place] de cualquier retraso o cierre.  Consejos  para la medicacin en dermatologa: Por favor, guarde las cajas en las que vienen los medicamentos de uso tpico para ayudarle a seguir las instrucciones sobre dnde y cmo usarlos. Las farmacias generalmente imprimen las instrucciones del medicamento slo en las cajas y no directamente en los tubos del [place].   Si su medicamento es muy caro, por favor, pngase en contacto con Zigmund Daniel llamando al [phone] y presione la opcin 4 o envenos un mensaje a travs de Pharmacist, community.   No podemos decirle cul ser su copago por los medicamentos por adelantado ya que esto es diferente dependiendo de la cobertura de su seguro. Sin embargo, es posible que podamos encontrar un medicamento sustituto a Electrical engineer un formulario para que el seguro cubra el medicamento que se considera necesario.   Si se requiere una autorizacin previa para que su compaa de seguros [place] su medicamento, por favor permtanos de 1 a 2  das hbiles para completar [place].  Los precios de los medicamentos varan con frecuencia dependiendo del Environmental consultant de dnde se surte la receta y alguna farmacias pueden ofrecer precios ms baratos.  El sitio web www.goodrx.com tiene cupones para medicamentos de Airline pilot. Los precios aqu no tienen en cuenta lo que podra costar con la ayuda del seguro (puede ser ms barato con su seguro), pero el sitio web puede darle el precio si no utiliz Research scientist (physical sciences).  - Puede imprimir el cupn correspondiente y llevarlo con su receta a la farmacia.  - Tambin puede pasar por nuestra oficina durante el horario de atencin regular y Charity fundraiser una tarjeta de cupones de GoodRx.  - Si necesita que su receta se enve electrnicamente a una farmacia diferente, informe a nuestra oficina a travs de MyChart de [place] o por telfono llamando al [phone] y presione la opcin 4.

## 2021-02-06 ENCOUNTER — Telehealth: Payer: Self-pay

## 2021-02-06 NOTE — Telephone Encounter (Signed)
-----   Message from Ralene Bathe, MD sent at 02/05/2021  5:30 PM EST ----- Diagnosis Skin , left upper lip BASAL CELL CARCINOMA, NODULAR AND INFILTRATIVE PATTERNS, BASE INVOLVED  Cancer - BCC Already treated Recheck next visit - may have some potential for recurrence

## 2021-02-06 NOTE — Telephone Encounter (Signed)
Left message on voicemail to return my call.  

## 2021-02-09 ENCOUNTER — Encounter: Payer: Self-pay | Admitting: Dermatology

## 2021-02-16 ENCOUNTER — Telehealth: Payer: Self-pay

## 2021-02-16 NOTE — Telephone Encounter (Signed)
-----   Message from Ralene Bathe, MD sent at 02/05/2021  5:30 PM EST ----- Diagnosis Skin , left upper lip BASAL CELL CARCINOMA, NODULAR AND INFILTRATIVE PATTERNS, BASE INVOLVED  Cancer - BCC Already treated Recheck next visit - may have some potential for recurrence

## 2021-02-16 NOTE — Telephone Encounter (Signed)
Advised pt's husband of bx results/sh 

## 2021-04-22 DIAGNOSIS — E7800 Pure hypercholesterolemia, unspecified: Secondary | ICD-10-CM | POA: Diagnosis not present

## 2021-04-25 ENCOUNTER — Encounter: Payer: Self-pay | Admitting: Dermatology

## 2021-04-29 DIAGNOSIS — F068 Other specified mental disorders due to known physiological condition: Secondary | ICD-10-CM | POA: Diagnosis not present

## 2021-04-29 DIAGNOSIS — Z0000 Encounter for general adult medical examination without abnormal findings: Secondary | ICD-10-CM | POA: Diagnosis not present

## 2021-04-29 DIAGNOSIS — M810 Age-related osteoporosis without current pathological fracture: Secondary | ICD-10-CM | POA: Diagnosis not present

## 2021-04-29 DIAGNOSIS — E538 Deficiency of other specified B group vitamins: Secondary | ICD-10-CM | POA: Diagnosis not present

## 2021-04-29 DIAGNOSIS — K219 Gastro-esophageal reflux disease without esophagitis: Secondary | ICD-10-CM | POA: Diagnosis not present

## 2021-04-29 DIAGNOSIS — F03A0 Unspecified dementia, mild, without behavioral disturbance, psychotic disturbance, mood disturbance, and anxiety: Secondary | ICD-10-CM | POA: Diagnosis not present

## 2021-04-29 DIAGNOSIS — E7800 Pure hypercholesterolemia, unspecified: Secondary | ICD-10-CM | POA: Diagnosis not present

## 2021-06-06 ENCOUNTER — Encounter: Payer: Self-pay | Admitting: Dermatology

## 2021-06-06 DIAGNOSIS — L719 Rosacea, unspecified: Secondary | ICD-10-CM

## 2021-06-08 MED ORDER — DOXYCYCLINE HYCLATE 20 MG PO TABS: ORAL_TABLET | ORAL | 3 refills | Status: DC

## 2021-06-08 NOTE — Addendum Note (Signed)
Addended by: Rudell Cobb A on: 06/08/2021 09:49 AM   Modules accepted: Orders

## 2021-06-19 DIAGNOSIS — B301 Conjunctivitis due to adenovirus: Secondary | ICD-10-CM | POA: Diagnosis not present

## 2021-07-29 DIAGNOSIS — R413 Other amnesia: Secondary | ICD-10-CM | POA: Diagnosis not present

## 2021-07-29 DIAGNOSIS — F411 Generalized anxiety disorder: Secondary | ICD-10-CM | POA: Diagnosis not present

## 2021-07-29 DIAGNOSIS — R2689 Other abnormalities of gait and mobility: Secondary | ICD-10-CM | POA: Diagnosis not present

## 2021-08-05 ENCOUNTER — Ambulatory Visit: Payer: PPO | Admitting: Dermatology

## 2021-08-05 DIAGNOSIS — L719 Rosacea, unspecified: Secondary | ICD-10-CM

## 2021-08-05 DIAGNOSIS — L821 Other seborrheic keratosis: Secondary | ICD-10-CM | POA: Diagnosis not present

## 2021-08-05 DIAGNOSIS — Z79899 Other long term (current) drug therapy: Secondary | ICD-10-CM | POA: Diagnosis not present

## 2021-08-05 DIAGNOSIS — L578 Other skin changes due to chronic exposure to nonionizing radiation: Secondary | ICD-10-CM

## 2021-08-05 DIAGNOSIS — Z872 Personal history of diseases of the skin and subcutaneous tissue: Secondary | ICD-10-CM | POA: Diagnosis not present

## 2021-08-05 NOTE — Patient Instructions (Addendum)
Continue Skin Medicinals Rosacea Triple cream at bedtime, Doxycycline 20 mg 1 tablet twice daily with food and plenty of fluid - advised she may decrease to 1 tablet daily or discontinue doxycycline and use when she flares.  Doxycycline should be taken with food to prevent nausea. Do not lay down for 30 minutes after taking. Be cautious with sun exposure and use good sun protection while on this medication. Pregnant women should not take this medication.    Due to recent changes in healthcare laws, you may see results of your pathology and/or laboratory studies on MyChart before the doctors have had a chance to review them. We understand that in some cases there may be results that are confusing or concerning to you. Please understand that not all results are received at the same time and often the doctors may need to interpret multiple results in order to provide you with the best plan of care or course of treatment. Therefore, we ask that you please give [place] 2 business days to thoroughly review all your results before contacting the office for clarification. Should we see a critical lab result, you will be contacted sooner.   If You Need Anything After Your Visit  If you have any questions or concerns for your doctor, please call our main line at [phone] and press option 4 to reach your doctor's medical assistant. If no one answers, please leave a voicemail as directed and we will return your call as soon as possible. Messages left after 4 pm will be answered the following business day.   You may also send [place] a message via [place]. We typically respond to MyChart messages within 1-2 business days.  For prescription refills, please ask your pharmacy to contact our office. Our fax number is [phone].  If you have an urgent issue when the clinic is closed that cannot wait until the next business day, you can page your doctor at the number below.    Please note that while we do our best to be  available for urgent issues outside of office hours, we are not available 24/7.   If you have an urgent issue and are unable to reach [place], you may choose to seek medical care at your doctor's office, retail clinic, urgent care center, or emergency room.  If you have a medical emergency, please immediately call 911 or go to the emergency department.  Pager Numbers  - Dr. Nehemiah Massed: [phone]  - Dr. Laurence Ferrari: [phone]  - Dr. Nicole Kindred: [phone]  In the event of inclement weather, please call our main line at [phone] for an update on the status of any delays or closures.  Dermatology Medication Tips: Please keep the boxes that topical medications come in in order to help keep track of the instructions about where and how to use these. Pharmacies typically print the medication instructions only on the boxes and not directly on the medication tubes.   If your medication is too expensive, please contact our office at [phone] option 4 or send [place] a message through [place].   We are unable to tell what your co-pay for medications will be in advance as this is different depending on your insurance coverage. However, we may be able to find a substitute medication at lower cost or fill out paperwork to get insurance to cover a needed medication.   If a prior authorization is required to get your medication covered by your insurance company, please allow [place] 1-2 business days to complete this  process.  Drug prices often vary depending on where the prescription is filled and some pharmacies may offer cheaper prices.  The website www.goodrx.com contains coupons for medications through different pharmacies. The prices here do not account for what the cost may be with help from insurance (it may be cheaper with your insurance), but the website can give you the price if you did not use any insurance.  - You can print the associated coupon and take it with your prescription to the pharmacy.  -  You may also stop by our office during regular business hours and pick up a GoodRx coupon card.  - If you need your prescription sent electronically to a different pharmacy, notify our office through [facility] or by phone at [phone] option 4.     Si Usted Necesita Algo Despus de Su Visita  Tambin puede enviarnos un mensaje a travs de Pharmacist, community. Por lo general respondemos a los mensajes de MyChart en el transcurso de 1 a 2 das hbiles.  Para renovar recetas, por favor pida a su farmacia que se ponga en contacto con nuestra oficina. Harland Dingwall de fax es [place] [phone].  Si tiene un asunto urgente cuando la clnica est cerrada y que no puede esperar hasta el siguiente da hbil, puede llamar/localizar a su doctor(a) al nmero que aparece a continuacin.   Por favor, tenga en cuenta que aunque hacemos todo lo posible para estar disponibles para asuntos urgentes fuera del horario de [place], no estamos disponibles las 24 horas del da, los 7 das de la [place].   Si tiene un problema urgente y no puede comunicarse con nosotros, puede optar por buscar atencin mdica  en el consultorio de su doctor(a), en una clnica privada, en un centro de atencin urgente o en una sala de emergencias.  Si tiene Engineering geologist, por favor llame inmediatamente al 911 o vaya a la sala de emergencias.  Nmeros de bper  - Dr. Nehemiah Massed: [phone]  - Dra. Moye: [phone]  - Dra. Nicole Kindred: [phone]  En caso de inclemencias del [place], por favor llame a Johnsie Kindred principal al [phone] para una actualizacin sobre el [place] de cualquier retraso o cierre.  Consejos para la medicacin en dermatologa: Por favor, guarde las cajas en las que vienen los medicamentos de uso tpico para ayudarle a seguir las instrucciones sobre dnde y cmo usarlos. Las farmacias generalmente imprimen las instrucciones del medicamento slo en las cajas y no directamente en los tubos del  [place].   Si su medicamento es muy caro, por favor, pngase en contacto con Zigmund Daniel llamando al [phone] y presione la opcin 4 o envenos un mensaje a travs de Pharmacist, community.   No podemos decirle cul ser su copago por los medicamentos por adelantado ya que esto es diferente dependiendo de la cobertura de su seguro. Sin embargo, es posible que podamos encontrar un medicamento sustituto a Electrical engineer un formulario para que el seguro cubra el medicamento que se considera necesario.   Si se requiere una autorizacin previa para que su compaa de seguros [place] su medicamento, por favor permtanos de 1 a 2 das hbiles para completar este proceso.  Los precios de los medicamentos varan con frecuencia dependiendo del Environmental consultant de dnde se surte la receta y alguna farmacias pueden ofrecer precios ms baratos.  El sitio web www.goodrx.com tiene cupones para medicamentos de Airline pilot. Los precios aqu no tienen en cuenta lo que podra costar con la ayuda del seguro (puede  barato con su seguro), pero el sitio web puede darle el precio si no utiliz ningn seguro.  - Puede imprimir el cupn correspondiente y llevarlo con su receta a la farmacia.  - Tambin puede pasar por nuestra oficina durante el horario de atencin regular y recoger una tarjeta de cupones de GoodRx.  - Si necesita que su receta se enve electrnicamente a una farmacia diferente, informe a nuestra oficina a travs de MyChart de [place] o por telfono llamando al [phone] y presione la opcin 4.  

## 2021-08-05 NOTE — Progress Notes (Signed)
Follow-Up Visit   Subjective  Barbara Waters is a 76 y.o. female who presents for the following: Actinic Keratosis (6 month follow up of forehead treated with LN2 x 2). The patient has spots, moles and lesions to be evaluated, some may be new or changing and the patient has concerns that these could be cancer.  Accompanied by husband  The following portions of the chart were reviewed this encounter and updated as appropriate:   Tobacco  Allergies  Meds  Problems  Med Hx  Surg Hx  Fam Hx     Review of Systems:  No other skin or systemic complaints except as noted in HPI or Assessment and Plan.  Objective  Well appearing patient in no apparent distress; mood and affect are within normal limits.  A focused examination was performed including face, arms. Relevant physical exam findings are noted in the Assessment and Plan.  Head - Anterior (Face) Pinkness of cheeks   Assessment & Plan   Actinic Damage - chronic, secondary to cumulative UV radiation exposure/sun exposure over time - diffuse scaly erythematous macules with underlying dyspigmentation - Recommend daily broad spectrum sunscreen SPF 30+ to sun-exposed areas, reapply every 2 hours as needed.  - Recommend staying in the shade or wearing long sleeves, sun glasses (UVA+UVB protection) and wide brim hats (4-inch brim around the entire circumference of the hat). - Call for new or changing lesions.  Seborrheic Keratoses - Stuck-on, waxy, tan-brown papules and/or plaques  - Benign-appearing - Discussed benign etiology and prognosis. - Observe - Call for any changes  Rosacea Head - Anterior (Face) Rosacea is a chronic progressive skin condition usually affecting the face of adults, causing redness and/or acne bumps. It is treatable but not curable. It sometimes affects the eyes (ocular rosacea) as well. It may respond to topical and/or systemic medication and can flare with stress, sun exposure, alcohol, exercise and  some foods.  Daily application of broad spectrum spf 30+ sunscreen to face is recommended to reduce flares.  Chronic and persistent condition with duration or expected duration over one year. Condition is symptomatic / bothersome to patient. Not to goal. Improved today after recent flare with extreme heat.  Continue Skin Medicinals Rosacea Triple cream at bedtime, Doxycycline 20 mg 1 tablet twice daily with food and plenty of fluid - advised she may decrease to 1 tablet daily or discontinue doxycycline and use when she flares.  Long term medication management.  Patient is using long term (months to years) prescription medication  to control their dermatologic condition.  These medications require periodic monitoring to evaluate for efficacy and side effects and may require periodic laboratory monitoring.  Related Medications doxycycline (PERIOSTAT) 20 MG tablet Take one tab po BID with food.  History of PreCancerous Actinic Keratosis  - site(s) of PreCancerous Actinic Keratosis clear today. - these may recur and new lesions may form requiring treatment to prevent transformation into skin cancer - observe for new or changing spots and contact [place] for appointment if occur - photoprotection with sun protective clothing; sunglasses and broad spectrum sunscreen with SPF of at least 30 + and frequent self skin exams recommended - yearly exams by a dermatologist recommended for persons with history of PreCancerous Actinic Keratoses  Return for 6-12 months.  I, Ashok Cordia, CMA, am acting as scribe for Sarina Ser, MD . Documentation: I have reviewed the above documentation for accuracy and completeness, and I agree with the above.  Sarina Ser, MD

## 2021-08-07 DIAGNOSIS — H35373 Puckering of macula, bilateral: Secondary | ICD-10-CM | POA: Diagnosis not present

## 2021-08-07 DIAGNOSIS — H43813 Vitreous degeneration, bilateral: Secondary | ICD-10-CM | POA: Diagnosis not present

## 2021-08-15 ENCOUNTER — Encounter: Payer: Self-pay | Admitting: Dermatology

## 2021-09-04 ENCOUNTER — Encounter: Payer: Self-pay | Admitting: Dermatology

## 2021-09-04 DIAGNOSIS — L719 Rosacea, unspecified: Secondary | ICD-10-CM

## 2021-09-07 MED ORDER — DOXYCYCLINE HYCLATE 20 MG PO TABS: ORAL_TABLET | ORAL | 4 refills | Status: DC

## 2021-10-04 ENCOUNTER — Encounter: Payer: Self-pay | Admitting: Dermatology

## 2021-10-27 DIAGNOSIS — E7800 Pure hypercholesterolemia, unspecified: Secondary | ICD-10-CM | POA: Diagnosis not present

## 2021-11-03 DIAGNOSIS — F03A0 Unspecified dementia, mild, without behavioral disturbance, psychotic disturbance, mood disturbance, and anxiety: Secondary | ICD-10-CM | POA: Diagnosis not present

## 2021-11-03 DIAGNOSIS — F068 Other specified mental disorders due to known physiological condition: Secondary | ICD-10-CM | POA: Diagnosis not present

## 2021-11-03 DIAGNOSIS — Z23 Encounter for immunization: Secondary | ICD-10-CM | POA: Diagnosis not present

## 2021-11-03 DIAGNOSIS — M810 Age-related osteoporosis without current pathological fracture: Secondary | ICD-10-CM | POA: Diagnosis not present

## 2021-11-03 DIAGNOSIS — K219 Gastro-esophageal reflux disease without esophagitis: Secondary | ICD-10-CM | POA: Diagnosis not present

## 2021-11-03 DIAGNOSIS — E7800 Pure hypercholesterolemia, unspecified: Secondary | ICD-10-CM | POA: Diagnosis not present

## 2021-11-03 DIAGNOSIS — E538 Deficiency of other specified B group vitamins: Secondary | ICD-10-CM | POA: Diagnosis not present

## 2021-11-03 DIAGNOSIS — Z0001 Encounter for general adult medical examination with abnormal findings: Secondary | ICD-10-CM | POA: Diagnosis not present

## 2022-02-03 DIAGNOSIS — R2689 Other abnormalities of gait and mobility: Secondary | ICD-10-CM | POA: Diagnosis not present

## 2022-02-03 DIAGNOSIS — F03B4 Unspecified dementia, moderate, with anxiety: Secondary | ICD-10-CM | POA: Diagnosis not present

## 2022-02-03 DIAGNOSIS — R413 Other amnesia: Secondary | ICD-10-CM | POA: Diagnosis not present

## 2022-02-03 DIAGNOSIS — F411 Generalized anxiety disorder: Secondary | ICD-10-CM | POA: Diagnosis not present

## 2022-02-15 ENCOUNTER — Ambulatory Visit: Payer: PPO | Admitting: Dermatology

## 2022-02-15 VITALS — BP 137/84 | HR 66

## 2022-02-15 DIAGNOSIS — L853 Xerosis cutis: Secondary | ICD-10-CM

## 2022-02-15 DIAGNOSIS — Z79899 Other long term (current) drug therapy: Secondary | ICD-10-CM

## 2022-02-15 DIAGNOSIS — L814 Other melanin hyperpigmentation: Secondary | ICD-10-CM

## 2022-02-15 DIAGNOSIS — L570 Actinic keratosis: Secondary | ICD-10-CM

## 2022-02-15 DIAGNOSIS — L578 Other skin changes due to chronic exposure to nonionizing radiation: Secondary | ICD-10-CM | POA: Diagnosis not present

## 2022-02-15 DIAGNOSIS — Z85828 Personal history of other malignant neoplasm of skin: Secondary | ICD-10-CM | POA: Diagnosis not present

## 2022-02-15 DIAGNOSIS — Z1283 Encounter for screening for malignant neoplasm of skin: Secondary | ICD-10-CM

## 2022-02-15 DIAGNOSIS — L821 Other seborrheic keratosis: Secondary | ICD-10-CM | POA: Diagnosis not present

## 2022-02-15 DIAGNOSIS — L820 Inflamed seborrheic keratosis: Secondary | ICD-10-CM | POA: Diagnosis not present

## 2022-02-15 DIAGNOSIS — F633 Trichotillomania: Secondary | ICD-10-CM | POA: Diagnosis not present

## 2022-02-15 DIAGNOSIS — Z7189 Other specified counseling: Secondary | ICD-10-CM

## 2022-02-15 DIAGNOSIS — D229 Melanocytic nevi, unspecified: Secondary | ICD-10-CM | POA: Diagnosis not present

## 2022-02-15 DIAGNOSIS — L719 Rosacea, unspecified: Secondary | ICD-10-CM

## 2022-02-15 NOTE — Patient Instructions (Signed)
Due to recent changes in healthcare laws, you may see results of your pathology and/or laboratory studies on MyChart before the doctors have had a chance to review them. We understand that in some cases there may be results that are confusing or concerning to you. Please understand that not all results are received at the same time and often the doctors may need to interpret multiple results in order to provide you with the best plan of care or course of treatment. Therefore, we ask that you please give us 2 business days to thoroughly review all your results before contacting the office for clarification. Should we see a critical lab result, you will be contacted sooner.   If You Need Anything After Your Visit  If you have any questions or concerns for your doctor, please call our main line at [phone] and press option 4 to reach your doctor's medical assistant. If no one answers, please leave a voicemail as directed and we will return your call as soon as possible. Messages left after 4 pm will be answered the following business day.   You may also send us a message via MyChart. We typically respond to MyChart messages within 1-2 business days.  For prescription refills, please ask your pharmacy to contact our office. Our fax number is [phone].  If you have an urgent issue when the clinic is closed that cannot wait until the next business day, you can page your doctor at the number below.    Please note that while we do our best to be available for urgent issues outside of office hours, we are not available 24/7.   If you have an urgent issue and are unable to reach us, you may choose to seek medical care at your doctor's office, retail clinic, urgent care center, or emergency room.  If you have a medical emergency, please immediately call 911 or go to the emergency department.  Pager Numbers  - Dr. Kowalski: [phone]  - Dr. Moye: [phone]  - Dr. Stewart:  [phone]  In the event of inclement weather, please call our main line at [phone] for an update on the status of any delays or closures.  Dermatology Medication Tips: Please keep the boxes that topical medications come in in order to help keep track of the instructions about where and how to use these. Pharmacies typically print the medication instructions only on the boxes and not directly on the medication tubes.   If your medication is too expensive, please contact our office at [phone] option 4 or send us a message through MyChart.   We are unable to tell what your co-pay for medications will be in advance as this is different depending on your insurance coverage. However, we may be able to find a substitute medication at lower cost or fill out paperwork to get insurance to cover a needed medication.   If a prior authorization is required to get your medication covered by your insurance company, please allow us 1-2 business days to complete this process.  Drug prices often vary depending on where the prescription is filled and some pharmacies may offer cheaper prices.  The website www.goodrx.com contains coupons for medications through different pharmacies. The prices here do not account for what the cost may be with help from insurance (it may be cheaper with your insurance), but the website can give you the price if you did not use any insurance.  - You can print the associated coupon and take it with   your prescription to the pharmacy.  - You may also stop by our office during regular business hours and pick up a GoodRx coupon card.  - If you need your prescription sent electronically to a different pharmacy, notify our office through [place] MyChart or by phone at [phone] option 4.     Si Usted Necesita Algo Despus de Su Visita  Tambin puede enviarnos un mensaje a travs de MyChart. Por lo general respondemos a los mensajes de MyChart en el transcurso de 1 a 2  das hbiles.  Para renovar recetas, por favor pida a su farmacia que se ponga en contacto con nuestra oficina. Nuestro nmero de fax es el [phone].  Si tiene un asunto urgente cuando la clnica est cerrada y que no puede esperar hasta el siguiente da hbil, puede llamar/localizar a su doctor(a) al nmero que aparece a continuacin.   Por favor, tenga en cuenta que aunque hacemos todo lo posible para estar disponibles para asuntos urgentes fuera del horario de oficina, no estamos disponibles las 24 horas del da, los 7 das de la semana.   Si tiene un problema urgente y no puede comunicarse con nosotros, puede optar por buscar atencin mdica  en el consultorio de su doctor(a), en una clnica privada, en un centro de atencin urgente o en una sala de emergencias.  Si tiene una emergencia mdica, por favor llame inmediatamente al 911 o vaya a la sala de emergencias.  Nmeros de bper  - Dr. Kowalski: [phone]  - Dra. Moye: [phone]  - Dra. Stewart: [phone]  En caso de inclemencias del tiempo, por favor llame a nuestra lnea principal al [phone] para una actualizacin sobre el estado de cualquier retraso o cierre.  Consejos para la medicacin en dermatologa: Por favor, guarde las cajas en las que vienen los medicamentos de uso tpico para ayudarle a seguir las instrucciones sobre dnde y cmo usarlos. Las farmacias generalmente imprimen las instrucciones del medicamento slo en las cajas y no directamente en los tubos del medicamento.   Si su medicamento es muy caro, por favor, pngase en contacto con nuestra oficina llamando al [phone] y presione la opcin 4 o envenos un mensaje a travs de MyChart.   No podemos decirle cul ser su copago por los medicamentos por adelantado ya que esto es diferente dependiendo de la cobertura de su seguro. Sin embargo, es posible que podamos encontrar un medicamento sustituto a menor costo o llenar un formulario para que el  seguro cubra el medicamento que se considera necesario.   Si se requiere una autorizacin previa para que su compaa de seguros cubra su medicamento, por favor permtanos de 1 a 2 das hbiles para completar este proceso.  Los precios de los medicamentos varan con frecuencia dependiendo del lugar de dnde se surte la receta y alguna farmacias pueden ofrecer precios ms baratos.  El sitio web www.goodrx.com tiene cupones para medicamentos de diferentes farmacias. Los precios aqu no tienen en cuenta lo que podra costar con la ayuda del seguro (puede ser ms barato con su seguro), pero el sitio web puede darle el precio si no utiliz ningn seguro.  - Puede imprimir el cupn correspondiente y llevarlo con su receta a la farmacia.  - Tambin puede pasar por nuestra oficina durante el horario de atencin regular y recoger una tarjeta de cupones de GoodRx.  - Si necesita que su receta se enve electrnicamente a una farmacia diferente, informe a nuestra oficina a travs de MyChart de [place]   o por telfono llamando al [phone] y presione la opcin 4.  

## 2022-02-15 NOTE — Progress Notes (Signed)
Follow-Up Visit   Subjective  Barbara Waters is a 77 y.o. female who presents for the following: Annual Exam (Hx BCC, Aks, ISKs). The patient presents for Total-Body Skin Exam (TBSE) for skin cancer screening and mole check.  The patient has spots, moles and lesions to be evaluated, some may be new or changing and the patient has concerns that these could be cancer.  The following portions of the chart were reviewed this encounter and updated as appropriate:   Tobacco  Allergies  Meds  Problems  Med Hx  Surg Hx  Fam Hx     Review of Systems:  No other skin or systemic complaints except as noted in HPI or Assessment and Plan.  Objective  Well appearing patient in no apparent distress; mood and affect are within normal limits.  A full examination was performed including scalp, head, eyes, ears, nose, lips, neck, chest, axillae, abdomen, back, buttocks, bilateral upper extremities, bilateral lower extremities, hands, feet, fingers, toes, fingernails, and toenails. All findings within normal limits unless otherwise noted below.  Scalp Broken hairs.  L neck x 1 Erythematous stuck-on, waxy papule or plaque  R nose x 1, L nose x 1 (2) Erythematous thin papules/macules with gritty scale.    Assessment & Plan  Rosacea Face Rosacea is a chronic progressive skin condition usually affecting the face of adults, causing redness and/or acne bumps. It is treatable but not curable. It sometimes affects the eyes (ocular rosacea) as well. It may respond to topical and/or systemic medication and can flare with stress, sun exposure, alcohol, exercise, topical steroids (including hydrocortisone/cortisone 10) and some foods.  Daily application of broad spectrum spf 30+ sunscreen to face is recommended to reduce flares.  Continue Doxycycline 20 mg po QD and  Skin Medicinals rosacea mix QD Azelaic Acid: 15%, Ivermectin: 1%, Metronidazole: 1%, Vehicle: Cream  Related Medications doxycycline  (PERIOSTAT) 20 MG tablet Take one tab po BID with food.  Trichotillomania Scalp Avoid pulling at hair and picking at scalp.   Inflamed seborrheic keratosis L neck x 1 Symptomatic, irritating, patient would like treated. Destruction of lesion - L neck x 1 Complexity: simple   Destruction method: cryotherapy   Informed consent: discussed and consent obtained   Timeout:  patient name, date of birth, surgical site, and procedure verified Lesion destroyed using liquid nitrogen: Yes   Region frozen until ice ball extended beyond lesion: Yes   Outcome: patient tolerated procedure well with no complications   Post-procedure details: wound care instructions given    AK (actinic keratosis) (2) R nose x 1, L nose x 1 Destruction of lesion - R nose x 1, L nose x 1 Complexity: simple   Destruction method: cryotherapy   Informed consent: discussed and consent obtained   Timeout:  patient name, date of birth, surgical site, and procedure verified Lesion destroyed using liquid nitrogen: Yes   Region frozen until ice ball extended beyond lesion: Yes   Outcome: patient tolerated procedure well with no complications   Post-procedure details: wound care instructions given    Lentigines - Scattered tan macules - Due to sun exposure - Benign-appearing, observe - Recommend daily broad spectrum sunscreen SPF 30+ to sun-exposed areas, reapply every 2 hours as needed. - Call for any changes  Seborrheic Keratoses - Stuck-on, waxy, tan-brown papules and/or plaques  - Benign-appearing - Discussed benign etiology and prognosis. - Observe - Call for any changes  Melanocytic Nevi - Tan-brown and/or pink-flesh-colored symmetric macules and papules -  Benign appearing on exam today - Observation - Call clinic for new or changing moles - Recommend daily use of broad spectrum spf 30+ sunscreen to sun-exposed areas.   Hemangiomas - Red papules - Discussed benign nature - Observe - Call for any  changes  Actinic Damage - Chronic condition, secondary to cumulative UV/sun exposure - diffuse scaly erythematous macules with underlying dyspigmentation - Recommend daily broad spectrum sunscreen SPF 30+ to sun-exposed areas, reapply every 2 hours as needed.  - Staying in the shade or wearing long sleeves, sun glasses (UVA+UVB protection) and wide brim hats (4-inch brim around the entire circumference of the hat) are also recommended for sun protection.  - Call for new or changing lesions.  History of Basal Cell Carcinoma of the Skin - No evidence of recurrence today - Recommend regular full body skin exams - Recommend daily broad spectrum sunscreen SPF 30+ to sun-exposed areas, reapply every 2 hours as needed.  - Call if any new or changing lesions are noted between office visits  Xerosis - diffuse xerotic patches - recommend gentle, hydrating skin care - gentle skin care handout given  Skin cancer screening performed today.  Return in about 6 months (around 08/16/2022) for sun exposed area check.  Luther Redo, CMA, am acting as scribe for Sarina Ser, MD . Documentation: I have reviewed the above documentation for accuracy and completeness, and I agree with the above.  Sarina Ser, MD

## 2022-02-19 ENCOUNTER — Encounter: Payer: Self-pay | Admitting: Dermatology

## 2022-04-28 DIAGNOSIS — E7800 Pure hypercholesterolemia, unspecified: Secondary | ICD-10-CM | POA: Diagnosis not present

## 2022-05-07 DIAGNOSIS — M810 Age-related osteoporosis without current pathological fracture: Secondary | ICD-10-CM | POA: Diagnosis not present

## 2022-05-07 DIAGNOSIS — E538 Deficiency of other specified B group vitamins: Secondary | ICD-10-CM | POA: Diagnosis not present

## 2022-05-07 DIAGNOSIS — R300 Dysuria: Secondary | ICD-10-CM | POA: Diagnosis not present

## 2022-05-07 DIAGNOSIS — F03A0 Unspecified dementia, mild, without behavioral disturbance, psychotic disturbance, mood disturbance, and anxiety: Secondary | ICD-10-CM | POA: Diagnosis not present

## 2022-05-07 DIAGNOSIS — F068 Other specified mental disorders due to known physiological condition: Secondary | ICD-10-CM | POA: Diagnosis not present

## 2022-05-07 DIAGNOSIS — Z0000 Encounter for general adult medical examination without abnormal findings: Secondary | ICD-10-CM | POA: Diagnosis not present

## 2022-05-07 DIAGNOSIS — E7800 Pure hypercholesterolemia, unspecified: Secondary | ICD-10-CM | POA: Diagnosis not present

## 2022-05-07 DIAGNOSIS — K219 Gastro-esophageal reflux disease without esophagitis: Secondary | ICD-10-CM | POA: Diagnosis not present

## 2022-07-29 ENCOUNTER — Ambulatory Visit: Payer: PPO | Admitting: Dermatology

## 2022-07-29 VITALS — BP 122/89 | HR 60

## 2022-07-29 DIAGNOSIS — L570 Actinic keratosis: Secondary | ICD-10-CM

## 2022-07-29 DIAGNOSIS — Z85828 Personal history of other malignant neoplasm of skin: Secondary | ICD-10-CM | POA: Diagnosis not present

## 2022-07-29 DIAGNOSIS — W908XXA Exposure to other nonionizing radiation, initial encounter: Secondary | ICD-10-CM

## 2022-07-29 DIAGNOSIS — D229 Melanocytic nevi, unspecified: Secondary | ICD-10-CM

## 2022-07-29 DIAGNOSIS — L821 Other seborrheic keratosis: Secondary | ICD-10-CM

## 2022-07-29 DIAGNOSIS — L578 Other skin changes due to chronic exposure to nonionizing radiation: Secondary | ICD-10-CM

## 2022-07-29 DIAGNOSIS — L814 Other melanin hyperpigmentation: Secondary | ICD-10-CM | POA: Diagnosis not present

## 2022-07-29 NOTE — Patient Instructions (Addendum)
Actinic keratoses are precancerous spots that appear secondary to cumulative UV radiation exposure/sun exposure over time. They are chronic with expected duration over 1 year. A portion of actinic keratoses will progress to squamous cell carcinoma of the skin. It is not possible to reliably predict which spots will progress to skin cancer and so treatment is recommended to prevent development of skin cancer.  Recommend daily broad spectrum sunscreen SPF 30+ to sun-exposed areas, reapply every 2 hours as needed.  Recommend staying in the shade or wearing long sleeves, sun glasses (UVA+UVB protection) and wide brim hats (4-inch brim around the entire circumference of the hat). Call for new or changing lesions.   Cryotherapy Aftercare  Wash gently with soap and water everyday.   Apply Vaseline and Band-Aid daily until healed.   Due to recent changes in healthcare laws, you may see results of your pathology and/or laboratory studies on MyChart before the doctors have had a chance to review them. We understand that in some cases there may be results that are confusing or concerning to you. Please understand that not all results are received at the same time and often the doctors may need to interpret multiple results in order to provide you with the best plan of care or course of treatment. Therefore, we ask that you please give us 2 business days to thoroughly review all your results before contacting the office for clarification. Should we see a critical lab result, you will be contacted sooner.   If You Need Anything After Your Visit  If you have any questions or concerns for your doctor, please call our main line at [phone] and press option 4 to reach your doctor's medical assistant. If no one answers, please leave a voicemail as directed and we will return your call as soon as possible. Messages left after 4 pm will be answered the following business day.   You may also send us a message via  MyChart. We typically respond to MyChart messages within 1-2 business days.  For prescription refills, please ask your pharmacy to contact our office. Our fax number is [phone].  If you have an urgent issue when the clinic is closed that cannot wait until the next business day, you can page your doctor at the number below.    Please note that while we do our best to be available for urgent issues outside of office hours, we are not available 24/7.   If you have an urgent issue and are unable to reach us, you may choose to seek medical care at your doctor's office, retail clinic, urgent care center, or emergency room.  If you have a medical emergency, please immediately call 911 or go to the emergency department.  Pager Numbers  - Dr. Kowalski: [phone]  - Dr. Moye: [phone]  - Dr. Stewart: [phone]  In the event of inclement weather, please call our main line at [phone] for an update on the status of any delays or closures.  Dermatology Medication Tips: Please keep the boxes that topical medications come in in order to help keep track of the instructions about where and how to use these. Pharmacies typically print the medication instructions only on the boxes and not directly on the medication tubes.   If your medication is too expensive, please contact our office at [phone] option 4 or send us a message through MyChart.   We are unable to tell what your co-pay for medications will be in advance as this is different   depending on your insurance coverage. However, we may be able to find a substitute medication at lower cost or fill out paperwork to get insurance to cover a needed medication.   If a prior authorization is required to get your medication covered by your insurance company, please allow us 1-2 business days to complete this process.  Drug prices often vary depending on where the prescription is filled and some pharmacies may offer cheaper  prices.  The website www.goodrx.com contains coupons for medications through different pharmacies. The prices here do not account for what the cost may be with help from insurance (it may be cheaper with your insurance), but the website can give you the price if you did not use any insurance.  - You can print the associated coupon and take it with your prescription to the pharmacy.  - You may also stop by our office during regular business hours and pick up a GoodRx coupon card.  - If you need your prescription sent electronically to a different pharmacy, notify our office through [facility] MyChart or by phone at [phone] option 4.     Si Usted Necesita Algo Despus de Su Visita  Tambin puede enviarnos un mensaje a travs de MyChart. Por lo general respondemos a los mensajes de MyChart en el transcurso de 1 a 2 das hbiles.  Para renovar recetas, por favor pida a su farmacia que se ponga en contacto con nuestra oficina. Nuestro nmero de fax es el [phone].  Si tiene un asunto urgente cuando la clnica est cerrada y que no puede esperar hasta el siguiente da hbil, puede llamar/localizar a su doctor(a) al nmero que aparece a continuacin.   Por favor, tenga en cuenta que aunque hacemos todo lo posible para estar disponibles para asuntos urgentes fuera del horario de oficina, no estamos disponibles las 24 horas del da, los 7 das de la semana.   Si tiene un problema urgente y no puede comunicarse con nosotros, puede optar por buscar atencin mdica  en el consultorio de su doctor(a), en una clnica privada, en un centro de atencin urgente o en una sala de emergencias.  Si tiene una emergencia mdica, por favor llame inmediatamente al 911 o vaya a la sala de emergencias.  Nmeros de bper  - Dr. Kowalski: [phone]  - Dra. Moye: [phone]  - Dra. Stewart: [phone]  En caso de inclemencias del tiempo, por favor llame a nuestra lnea principal al [phone]  para una actualizacin sobre el estado de cualquier retraso o cierre.  Consejos para la medicacin en dermatologa: Por favor, guarde las cajas en las que vienen los medicamentos de uso tpico para ayudarle a seguir las instrucciones sobre dnde y cmo usarlos. Las farmacias generalmente imprimen las instrucciones del medicamento slo en las cajas y no directamente en los tubos del medicamento.   Si su medicamento es muy caro, por favor, pngase en contacto con nuestra oficina llamando al [phone] y presione la opcin 4 o envenos un mensaje a travs de MyChart.   No podemos decirle cul ser su copago por los medicamentos por adelantado ya que esto es diferente dependiendo de la cobertura de su seguro. Sin embargo, es posible que podamos encontrar un medicamento sustituto a menor costo o llenar un formulario para que el seguro cubra el medicamento que se considera necesario.   Si se requiere una autorizacin previa para que su compaa de seguros cubra su medicamento, por favor permtanos de 1 a 2 das hbiles para completar este   proceso.  Los precios de los medicamentos varan con frecuencia dependiendo del lugar de dnde se surte la receta y alguna farmacias pueden ofrecer precios ms baratos.  El sitio web www.goodrx.com tiene cupones para medicamentos de diferentes farmacias. Los precios aqu no tienen en cuenta lo que podra costar con la ayuda del seguro (puede ser ms barato con su seguro), pero el sitio web puede darle el precio si no utiliz ningn seguro.  - Puede imprimir el cupn correspondiente y llevarlo con su receta a la farmacia.  - Tambin puede pasar por nuestra oficina durante el horario de atencin regular y recoger una tarjeta de cupones de GoodRx.  - Si necesita que su receta se enve electrnicamente a una farmacia diferente, informe a nuestra oficina a travs de MyChart de [facility] o por telfono llamando al [phone] y presione la opcin 4.  

## 2022-07-29 NOTE — Progress Notes (Signed)
   Follow-Up Visit   Subjective  Barbara Waters is a 77 y.o. female who presents for the following: 6 month recheck sun exposed areas Husband is with patient and contributes to history.  The patient has spots, moles and lesions to be evaluated, some may be new or changing and the patient may have concern these could be cancer.    The following portions of the chart were reviewed this encounter and updated as appropriate: medications, allergies, medical history  Review of Systems:  No other skin or systemic complaints except as noted in HPI or Assessment and Plan.  Objective  Well appearing patient in no apparent distress; mood and affect are within normal limits.   A focused examination was performed of the following areas: Arms, legs, face, ears, hands  Relevant exam findings are noted in the Assessment and Plan.  right cheek x 1 Erythematous thin papules/macules with gritty scale.     Assessment & Plan   SEBORRHEIC KERATOSIS - Stuck-on, waxy, tan-brown papules and/or plaques  - Benign-appearing - Discussed benign etiology and prognosis. - Observe - Call for any changes  MELANOCYTIC NEVI Exam: Tan-brown and/or pink-flesh-colored symmetric macules and papules  Treatment Plan: Benign appearing on exam today. Recommend observation. Call clinic for new or changing moles. Recommend daily use of broad spectrum spf 30+ sunscreen to sun-exposed areas.    LENTIGINES Exam: scattered tan macules Due to sun exposure Treatment Plan: Benign-appearing, observe. Recommend daily broad spectrum sunscreen SPF 30+ to sun-exposed areas, reapply every 2 hours as needed.  Call for any changes   Actinic keratosis right cheek x 1  Actinic keratoses are precancerous spots that appear secondary to cumulative UV radiation exposure/sun exposure over time. They are chronic with expected duration over 1 year. A portion of actinic keratoses will progress to squamous cell carcinoma of the skin.  It is not possible to reliably predict which spots will progress to skin cancer and so treatment is recommended to prevent development of skin cancer.  Recommend daily broad spectrum sunscreen SPF 30+ to sun-exposed areas, reapply every 2 hours as needed.  Recommend staying in the shade or wearing long sleeves, sun glasses (UVA+UVB protection) and wide brim hats (4-inch brim around the entire circumference of the hat). Call for new or changing lesions.  Destruction of lesion - right cheek x 1 Complexity: simple   Destruction method: cryotherapy   Informed consent: discussed and consent obtained   Timeout:  patient name, date of birth, surgical site, and procedure verified Lesion destroyed using liquid nitrogen: Yes   Region frozen until ice ball extended beyond lesion: Yes   Outcome: patient tolerated procedure well with no complications   Post-procedure details: wound care instructions given     HISTORY OF BASAL CELL CARCINOMA OF THE SKIN 02/04/21 at left upper lip ED&C Other areas see history  - No evidence of recurrence today - Recommend regular full body skin exams - Recommend daily broad spectrum sunscreen SPF 30+ to sun-exposed areas, reapply every 2 hours as needed.  - Call if any new or changing lesions are noted between office visits   Return for feb - march ak and sun exposed area follow up.  IAsher Muir, CMA, am acting as scribe for Armida Sans, MD.   Documentation: I have reviewed the above documentation for accuracy and completeness, and I agree with the above.  Armida Sans, MD

## 2022-07-30 ENCOUNTER — Encounter: Payer: Self-pay | Admitting: Dermatology

## 2022-08-04 DIAGNOSIS — F411 Generalized anxiety disorder: Secondary | ICD-10-CM | POA: Diagnosis not present

## 2022-08-04 DIAGNOSIS — R2689 Other abnormalities of gait and mobility: Secondary | ICD-10-CM | POA: Diagnosis not present

## 2022-08-04 DIAGNOSIS — R413 Other amnesia: Secondary | ICD-10-CM | POA: Diagnosis not present

## 2022-08-04 DIAGNOSIS — F03B4 Unspecified dementia, moderate, with anxiety: Secondary | ICD-10-CM | POA: Diagnosis not present

## 2022-08-11 DIAGNOSIS — R399 Unspecified symptoms and signs involving the genitourinary system: Secondary | ICD-10-CM | POA: Diagnosis not present

## 2022-08-12 DIAGNOSIS — H40003 Preglaucoma, unspecified, bilateral: Secondary | ICD-10-CM | POA: Diagnosis not present

## 2022-08-12 DIAGNOSIS — H35379 Puckering of macula, unspecified eye: Secondary | ICD-10-CM | POA: Diagnosis not present

## 2022-08-12 DIAGNOSIS — H43813 Vitreous degeneration, bilateral: Secondary | ICD-10-CM | POA: Diagnosis not present

## 2022-08-14 DIAGNOSIS — S93401A Sprain of unspecified ligament of right ankle, initial encounter: Secondary | ICD-10-CM | POA: Diagnosis not present

## 2022-08-18 DIAGNOSIS — S93402D Sprain of unspecified ligament of left ankle, subsequent encounter: Secondary | ICD-10-CM | POA: Diagnosis not present

## 2022-11-03 DIAGNOSIS — E7800 Pure hypercholesterolemia, unspecified: Secondary | ICD-10-CM | POA: Diagnosis not present

## 2022-11-10 DIAGNOSIS — E7800 Pure hypercholesterolemia, unspecified: Secondary | ICD-10-CM | POA: Diagnosis not present

## 2022-11-10 DIAGNOSIS — F068 Other specified mental disorders due to known physiological condition: Secondary | ICD-10-CM | POA: Diagnosis not present

## 2022-11-10 DIAGNOSIS — F03A0 Unspecified dementia, mild, without behavioral disturbance, psychotic disturbance, mood disturbance, and anxiety: Secondary | ICD-10-CM | POA: Diagnosis not present

## 2022-11-10 DIAGNOSIS — E538 Deficiency of other specified B group vitamins: Secondary | ICD-10-CM | POA: Diagnosis not present

## 2022-11-10 DIAGNOSIS — K219 Gastro-esophageal reflux disease without esophagitis: Secondary | ICD-10-CM | POA: Diagnosis not present

## 2022-11-10 DIAGNOSIS — Z0001 Encounter for general adult medical examination with abnormal findings: Secondary | ICD-10-CM | POA: Diagnosis not present

## 2023-02-01 ENCOUNTER — Ambulatory Visit: Payer: PPO | Admitting: Dermatology

## 2023-02-02 ENCOUNTER — Ambulatory Visit
Admission: RE | Admit: 2023-02-02 | Discharge: 2023-02-02 | Disposition: A | Discharge status: Home / Self Care | Payer: PPO | Source: Ambulatory Visit | Attending: Family Medicine | Admitting: Family Medicine

## 2023-02-02 ENCOUNTER — Other Ambulatory Visit: Payer: Self-pay | Admitting: Family Medicine

## 2023-02-02 DIAGNOSIS — R300 Dysuria: Secondary | ICD-10-CM | POA: Insufficient documentation

## 2023-02-02 DIAGNOSIS — R1013 Epigastric pain: Secondary | ICD-10-CM

## 2023-02-02 DIAGNOSIS — R1032 Left lower quadrant pain: Secondary | ICD-10-CM

## 2023-02-02 DIAGNOSIS — M545 Low back pain, unspecified: Secondary | ICD-10-CM | POA: Diagnosis not present

## 2023-02-02 DIAGNOSIS — K5730 Diverticulosis of large intestine without perforation or abscess without bleeding: Secondary | ICD-10-CM | POA: Diagnosis not present

## 2023-02-02 DIAGNOSIS — G8929 Other chronic pain: Secondary | ICD-10-CM | POA: Diagnosis not present

## 2023-02-02 DIAGNOSIS — K529 Noninfective gastroenteritis and colitis, unspecified: Secondary | ICD-10-CM | POA: Diagnosis not present

## 2023-02-02 MED ORDER — IOHEXOL 300 MG/ML  SOLN
75.0000 mL | Freq: Once | INTRAMUSCULAR | Status: AC | PRN
  Administered 2023-02-02: 75 mL via INTRAVENOUS

## 2023-02-03 DIAGNOSIS — R1013 Epigastric pain: Secondary | ICD-10-CM | POA: Diagnosis not present

## 2023-02-03 DIAGNOSIS — G8929 Other chronic pain: Secondary | ICD-10-CM | POA: Diagnosis not present

## 2023-02-03 DIAGNOSIS — K529 Noninfective gastroenteritis and colitis, unspecified: Secondary | ICD-10-CM | POA: Diagnosis not present

## 2023-02-03 DIAGNOSIS — R300 Dysuria: Secondary | ICD-10-CM | POA: Diagnosis not present

## 2023-02-03 DIAGNOSIS — M545 Low back pain, unspecified: Secondary | ICD-10-CM | POA: Diagnosis not present

## 2023-02-03 DIAGNOSIS — R1032 Left lower quadrant pain: Secondary | ICD-10-CM | POA: Diagnosis not present

## 2023-02-07 DIAGNOSIS — R413 Other amnesia: Secondary | ICD-10-CM | POA: Diagnosis not present

## 2023-02-07 DIAGNOSIS — F03B4 Unspecified dementia, moderate, with anxiety: Secondary | ICD-10-CM | POA: Diagnosis not present

## 2023-02-07 DIAGNOSIS — R2689 Other abnormalities of gait and mobility: Secondary | ICD-10-CM | POA: Diagnosis not present

## 2023-02-07 DIAGNOSIS — F411 Generalized anxiety disorder: Secondary | ICD-10-CM | POA: Diagnosis not present

## 2023-03-09 ENCOUNTER — Ambulatory Visit: Payer: PPO | Admitting: Dermatology

## 2023-03-09 DIAGNOSIS — Z85828 Personal history of other malignant neoplasm of skin: Secondary | ICD-10-CM | POA: Diagnosis not present

## 2023-03-09 DIAGNOSIS — L820 Inflamed seborrheic keratosis: Secondary | ICD-10-CM | POA: Diagnosis not present

## 2023-03-09 DIAGNOSIS — L570 Actinic keratosis: Secondary | ICD-10-CM

## 2023-03-09 DIAGNOSIS — Z79899 Other long term (current) drug therapy: Secondary | ICD-10-CM | POA: Diagnosis not present

## 2023-03-09 DIAGNOSIS — Z872 Personal history of diseases of the skin and subcutaneous tissue: Secondary | ICD-10-CM | POA: Diagnosis not present

## 2023-03-09 DIAGNOSIS — L719 Rosacea, unspecified: Secondary | ICD-10-CM | POA: Diagnosis not present

## 2023-03-09 DIAGNOSIS — Z7189 Other specified counseling: Secondary | ICD-10-CM

## 2023-03-09 DIAGNOSIS — W908XXA Exposure to other nonionizing radiation, initial encounter: Secondary | ICD-10-CM | POA: Diagnosis not present

## 2023-03-09 DIAGNOSIS — L578 Other skin changes due to chronic exposure to nonionizing radiation: Secondary | ICD-10-CM | POA: Diagnosis not present

## 2023-03-09 MED ORDER — DOXYCYCLINE HYCLATE 20 MG PO TABS: ORAL_TABLET | ORAL | 2 refills | Status: DC

## 2023-03-09 NOTE — Progress Notes (Unsigned)
 Follow-Up Visit   Subjective  Barbara Waters is a 78 y.o. female who presents for the following: 6 month ak follow up hx of of ak at right cheek. Hx of isks.  Hx of rosacea using skin medicinal cream and doxycycline 20 mg. Patient would like refills.   The patient has spots, moles and lesions to be evaluated, some may be new or changing and the patient may have concern these could be cancer.  The following portions of the chart were reviewed this encounter and updated as appropriate: medications, allergies, medical history  Review of Systems:  No other skin or systemic complaints except as noted in HPI or Assessment and Plan.  Objective  Well appearing patient in no apparent distress; mood and affect are within normal limits.  A focused examination was performed of the following areas: face Relevant exam findings are noted in the Assessment and Plan.  right cheek zygoma x 2 , superior forehead x 1 (3) Erythematous thin papules/macules with gritty scale.  right forehead x 1, right infraorbital x 1, right medial lower eyelid x 1 (3) Erythematous stuck-on, waxy papule or plaque  Assessment & Plan   Rosacea Face Exam: mild pinkness at mid cheeks and nose Chronic and persistent condition with duration or expected duration over one year. Condition is improving with treatment but not currently at goal. Rosacea is a chronic progressive skin condition usually affecting the face of adults, causing redness and/or acne bumps. It is treatable but not curable. It sometimes affects the eyes (ocular rosacea) as well. It may respond to topical and/or systemic medication and can flare with stress, sun exposure, alcohol, exercise, topical steroids (including hydrocortisone/cortisone 10) and some foods.  Daily application of broad spectrum spf 30+ sunscreen to face is recommended to reduce flares.   Continue as needed for flares Doxycycline 20 mg po QD   Continue Medicinals rosacea Azelaic Acid:  15%, Ivermectin: 1%, Metronidazole: 1%, Vehicle: Cream mix at bedtime   ACTINIC DAMAGE - chronic, secondary to cumulative UV radiation exposure/sun exposure over time - diffuse scaly erythematous macules with underlying dyspigmentation - Recommend daily broad spectrum sunscreen SPF 30+ to sun-exposed areas, reapply every 2 hours as needed.  - Recommend staying in the shade or wearing long sleeves, sun glasses (UVA+UVB protection) and wide brim hats (4-inch brim around the entire circumference of the hat). - Call for new or changing lesions.  HISTORY OF BASAL CELL CARCINOMA OF THE SKIN 02/04/21 at left upper lip ED&C Other areas see history  - No evidence of recurrence today - Recommend regular full body skin exams - Recommend daily broad spectrum sunscreen SPF 30+ to sun-exposed areas, reapply every 2 hours as needed.  - Call if any new or changing lesions are noted between office visits  ACTINIC KERATOSIS (3) right cheek zygoma x 2 , superior forehead x 1 (3) Actinic keratoses are precancerous spots that appear secondary to cumulative UV radiation exposure/sun exposure over time. They are chronic with expected duration over 1 year. A portion of actinic keratoses will progress to squamous cell carcinoma of the skin. It is not possible to reliably predict which spots will progress to skin cancer and so treatment is recommended to prevent development of skin cancer.  Recommend daily broad spectrum sunscreen SPF 30+ to sun-exposed areas, reapply every 2 hours as needed.  Recommend staying in the shade or wearing long sleeves, sun glasses (UVA+UVB protection) and wide brim hats (4-inch brim around the entire circumference of the hat).  Call for new or changing lesions. Destruction of lesion - right cheek zygoma x 2 , superior forehead x 1 (3) Complexity: simple   Destruction method: cryotherapy   Informed consent: discussed and consent obtained   Timeout:  patient name, date of birth, surgical  site, and procedure verified Lesion destroyed using liquid nitrogen: Yes   Region frozen until ice ball extended beyond lesion: Yes   Outcome: patient tolerated procedure well with no complications   Post-procedure details: wound care instructions given   ROSACEA   Related Medications doxycycline (PERIOSTAT) 20 MG tablet Take one tab po qd with food. INFLAMED SEBORRHEIC KERATOSIS (3) right forehead x 1, right infraorbital x 1, right medial lower eyelid x 1 (3) Symptomatic, irritating, patient would like treated. Destruction of lesion - right forehead x 1, right infraorbital x 1, right medial lower eyelid x 1 (3) Complexity: simple   Destruction method: cryotherapy   Informed consent: discussed and consent obtained   Timeout:  patient name, date of birth, surgical site, and procedure verified Lesion destroyed using liquid nitrogen: Yes   Region frozen until ice ball extended beyond lesion: Yes   Outcome: patient tolerated procedure well with no complications   Post-procedure details: wound care instructions given    Return for 6 month tbse hx of bcc hx of aks.  IAsher Muir, CMA, am acting as scribe for Armida Sans, MD.   Documentation: I have reviewed the above documentation for accuracy and completeness, and I agree with the above.  Armida Sans, MD

## 2023-03-09 NOTE — Patient Instructions (Addendum)
 Instructions for [facility] Medications  One or more of your medications was sent to the [facility] mail order compounding pharmacy. You will receive an email from them and can purchase the medicine through that link. It will then be mailed to your home at the address you confirmed. If for any reason you do not receive an email from them, please check your spam folder. If you still do not find the email, please let us know. [facility] phone number is [phone].     Actinic keratoses are precancerous spots that appear secondary to cumulative UV radiation exposure/sun exposure over time. They are chronic with expected duration over 1 year. A portion of actinic keratoses will progress to squamous cell carcinoma of the skin. It is not possible to reliably predict which spots will progress to skin cancer and so treatment is recommended to prevent development of skin cancer.  Recommend daily broad spectrum sunscreen SPF 30+ to sun-exposed areas, reapply every 2 hours as needed.  Recommend staying in the shade or wearing long sleeves, sun glasses (UVA+UVB protection) and wide brim hats (4-inch brim around the entire circumference of the hat). Call for new or changing lesions.   Cryotherapy Aftercare  Wash gently with soap and water everyday.   Apply Vaseline and Band-Aid daily until healed.   Seborrheic Keratosis  What causes seborrheic keratoses? Seborrheic keratoses are harmless, common skin growths that first appear during adult life.  As time goes by, more growths appear.  Some people may develop a large number of them.  Seborrheic keratoses appear on both covered and uncovered body parts.  They are not caused by sunlight.  The tendency to develop seborrheic keratoses can be inherited.  They vary in color from skin-colored to gray, brown, or even black.  They can be either smooth or have a rough, warty surface.   Seborrheic keratoses are superficial and look as if they were stuck  on the skin.  Under the microscope this type of keratosis looks like layers upon layers of skin.  That is why at times the top layer may seem to fall off, but the rest of the growth remains and re-grows.    Treatment Seborrheic keratoses do not need to be treated, but can easily be removed in the office.  Seborrheic keratoses often cause symptoms when they rub on clothing or jewelry.  Lesions can be in the way of shaving.  If they become inflamed, they can cause itching, soreness, or burning.  Removal of a seborrheic keratosis can be accomplished by freezing, burning, or surgery. If any spot bleeds, scabs, or grows rapidly, please return to have it checked, as these can be an indication of a skin cancer.     Due to recent changes in healthcare laws, you may see results of your pathology and/or laboratory studies on MyChart before the doctors have had a chance to review them. We understand that in some cases there may be results that are confusing or concerning to you. Please understand that not all results are received at the same time and often the doctors may need to interpret multiple results in order to provide you with the best plan of care or course of treatment. Therefore, we ask that you please give [place] 2 business days to thoroughly review all your results before contacting the office for clarification. Should we see a critical lab result, you will be contacted sooner.   If You Need Anything After Your Visit  If you have any  questions or concerns for your doctor, please call our main line at [phone] and press option 4 to reach your doctor's medical assistant. If no one answers, please leave a voicemail as directed and we will return your call as soon as possible. Messages left after 4 pm will be answered the following business day.   You may also send [place] a message via MyChart. We typically respond to MyChart messages within 1-2 business days.  For prescription refills, please ask your  pharmacy to contact our office. Our fax number is [phone].  If you have an urgent issue when the clinic is closed that cannot wait until the next business day, you can page your doctor at the number below.    Please note that while we do our best to be available for urgent issues outside of office hours, we are not available 24/7.   If you have an urgent issue and are unable to reach [place], you may choose to seek medical care at your doctor's office, retail clinic, urgent care center, or emergency room.  If you have a medical emergency, please immediately call 911 or go to the emergency department.  Pager Numbers  - Dr. Gwen Pounds: [phone]  - Dr. Roseanne Reno: [phone]  - Dr. Katrinka Blazing: [phone]   In the event of inclement weather, please call our main line at [phone] for an update on the status of any delays or closures.  Dermatology Medication Tips: Please keep the boxes that topical medications come in in order to help keep track of the instructions about where and how to use these. Pharmacies typically print the medication instructions only on the boxes and not directly on the medication tubes.   If your medication is too expensive, please contact our office at [phone] option 4 or send [place] a message through MyChart.   We are unable to tell what your co-pay for medications will be in advance as this is different depending on your insurance coverage. However, we may be able to find a substitute medication at lower cost or fill out paperwork to get insurance to cover a needed medication.   If a prior authorization is required to get your medication covered by your insurance company, please allow [place] 1-2 business days to complete this process.  Drug prices often vary depending on where the prescription is filled and some pharmacies may offer cheaper prices.  The website www.goodrx.com contains coupons for medications through different pharmacies. The prices here do not  account for what the cost may be with help from insurance (it may be cheaper with your insurance), but the website can give you the price if you did not use any insurance.  - You can print the associated coupon and take it with your prescription to the pharmacy.  - You may also stop by our office during regular business hours and pick up a GoodRx coupon card.  - If you need your prescription sent electronically to a different pharmacy, notify our office through [facility] or by phone at [phone] option 4.     Si Usted Necesita Algo Despus de Su Visita  Tambin puede enviarnos un mensaje a travs de Clinical cytogeneticist. Por lo general respondemos a los mensajes de MyChart en el transcurso de 1 a 2 das hbiles.  Para renovar recetas, por favor pida a su farmacia que se ponga en contacto con nuestra oficina. Annie Sable de fax es [place] [phone].  Si tiene un asunto urgente cuando la clnica est cerrada y que  no puede esperar hasta el siguiente da hbil, puede llamar/localizar a su doctor(a) al nmero que aparece a continuacin.   Por favor, tenga en cuenta que aunque hacemos todo lo posible para estar disponibles para asuntos urgentes fuera del horario de [place], no estamos disponibles las 24 horas del da, los 7 [address]  [address] de la [place].   Si tiene un problema urgente y no puede comunicarse con nosotros, puede optar por buscar atencin mdica  en el consultorio de su doctor(a), en una clnica privada, en un centro de atencin urgente o en una sala de emergencias.  Si tiene Engineer, drilling, por favor llame inmediatamente al 911 o vaya a la sala de emergencias.  Nmeros de bper  - Dr. Gwen Pounds: [phone]  - Dra. Roseanne Reno: [phone]  - Dr. Katrinka Blazing: [phone]   En caso de inclemencias del tiempo, por favor llame a Lacy Duverney principal al [phone] para una actualizacin sobre el [place] de cualquier retraso o cierre.  Consejos para la medicacin en dermatologa: Por  favor, guarde las cajas en las que vienen los medicamentos de uso tpico para ayudarle a seguir las instrucciones sobre dnde y cmo usarlos. Las farmacias generalmente imprimen las instrucciones del medicamento slo en las cajas y no directamente en los tubos del [place].   Si su medicamento es muy caro, por favor, pngase en contacto con Rolm Gala llamando al [phone] y presione la opcin 4 o envenos un mensaje a travs de Clinical cytogeneticist.   No podemos decirle cul ser su copago por los medicamentos por adelantado ya que esto es diferente dependiendo de la cobertura de su seguro. Sin embargo, es posible que podamos encontrar un medicamento sustituto a Audiological scientist un formulario para que el seguro cubra el medicamento que se considera necesario.   Si se requiere una autorizacin previa para que su compaa de seguros [place] su medicamento, por favor permtanos de 1 a 2 das hbiles para completar [address]  Los precios de los medicamentos varan con frecuencia dependiendo del Environmental consultant de dnde se surte la receta y alguna farmacias pueden ofrecer precios ms baratos.  El sitio web www.goodrx.com tiene cupones para medicamentos de Health and safety inspector. Los precios aqu no tienen en cuenta lo que podra costar con la ayuda del seguro (puede ser ms barato con su seguro), pero el sitio web puede darle el precio si no utiliz Tourist information centre manager.  - Puede imprimir el cupn correspondiente y llevarlo con su receta a la farmacia.  - Tambin puede pasar por nuestra oficina durante el horario de atencin regular y Education officer, museum una tarjeta de cupones de GoodRx.  - Si necesita que su receta se enve electrnicamente a una farmacia diferente, informe a nuestra oficina a travs de MyChart de [place] o por telfono llamando al [phone] y presione la opcin 4.

## 2023-03-10 ENCOUNTER — Encounter: Payer: Self-pay | Admitting: Dermatology

## 2023-05-04 DIAGNOSIS — E7800 Pure hypercholesterolemia, unspecified: Secondary | ICD-10-CM | POA: Diagnosis not present

## 2023-05-11 DIAGNOSIS — F03A0 Unspecified dementia, mild, without behavioral disturbance, psychotic disturbance, mood disturbance, and anxiety: Secondary | ICD-10-CM | POA: Diagnosis not present

## 2023-05-11 DIAGNOSIS — E538 Deficiency of other specified B group vitamins: Secondary | ICD-10-CM | POA: Diagnosis not present

## 2023-05-11 DIAGNOSIS — F068 Other specified mental disorders due to known physiological condition: Secondary | ICD-10-CM | POA: Diagnosis not present

## 2023-05-11 DIAGNOSIS — K219 Gastro-esophageal reflux disease without esophagitis: Secondary | ICD-10-CM | POA: Diagnosis not present

## 2023-05-11 DIAGNOSIS — E7800 Pure hypercholesterolemia, unspecified: Secondary | ICD-10-CM | POA: Diagnosis not present

## 2023-05-11 DIAGNOSIS — Z0000 Encounter for general adult medical examination without abnormal findings: Secondary | ICD-10-CM | POA: Diagnosis not present

## 2023-08-08 DIAGNOSIS — R413 Other amnesia: Secondary | ICD-10-CM | POA: Diagnosis not present

## 2023-08-08 DIAGNOSIS — F03B4 Unspecified dementia, moderate, with anxiety: Secondary | ICD-10-CM | POA: Diagnosis not present

## 2023-08-08 DIAGNOSIS — R2689 Other abnormalities of gait and mobility: Secondary | ICD-10-CM | POA: Diagnosis not present

## 2023-08-08 DIAGNOSIS — F411 Generalized anxiety disorder: Secondary | ICD-10-CM | POA: Diagnosis not present

## 2023-08-08 DIAGNOSIS — Z1331 Encounter for screening for depression: Secondary | ICD-10-CM | POA: Diagnosis not present

## 2023-09-07 ENCOUNTER — Ambulatory Visit: Payer: PPO | Admitting: Dermatology

## 2023-09-07 ENCOUNTER — Encounter: Payer: Self-pay | Admitting: Dermatology

## 2023-09-07 DIAGNOSIS — Z1283 Encounter for screening for malignant neoplasm of skin: Secondary | ICD-10-CM | POA: Diagnosis not present

## 2023-09-07 DIAGNOSIS — L720 Epidermal cyst: Secondary | ICD-10-CM | POA: Diagnosis not present

## 2023-09-07 DIAGNOSIS — L814 Other melanin hyperpigmentation: Secondary | ICD-10-CM

## 2023-09-07 DIAGNOSIS — L578 Other skin changes due to chronic exposure to nonionizing radiation: Secondary | ICD-10-CM

## 2023-09-07 DIAGNOSIS — L820 Inflamed seborrheic keratosis: Secondary | ICD-10-CM

## 2023-09-07 DIAGNOSIS — Z85828 Personal history of other malignant neoplasm of skin: Secondary | ICD-10-CM | POA: Diagnosis not present

## 2023-09-07 DIAGNOSIS — W908XXA Exposure to other nonionizing radiation, initial encounter: Secondary | ICD-10-CM | POA: Diagnosis not present

## 2023-09-07 DIAGNOSIS — Z79899 Other long term (current) drug therapy: Secondary | ICD-10-CM

## 2023-09-07 DIAGNOSIS — D1801 Hemangioma of skin and subcutaneous tissue: Secondary | ICD-10-CM | POA: Diagnosis not present

## 2023-09-07 DIAGNOSIS — L719 Rosacea, unspecified: Secondary | ICD-10-CM

## 2023-09-07 DIAGNOSIS — L570 Actinic keratosis: Secondary | ICD-10-CM | POA: Diagnosis not present

## 2023-09-07 DIAGNOSIS — Z7189 Other specified counseling: Secondary | ICD-10-CM | POA: Diagnosis not present

## 2023-09-07 DIAGNOSIS — D229 Melanocytic nevi, unspecified: Secondary | ICD-10-CM

## 2023-09-07 MED ORDER — DOXYCYCLINE HYCLATE 20 MG PO TABS: ORAL_TABLET | ORAL | 2 refills | Status: AC

## 2023-09-07 NOTE — Patient Instructions (Addendum)
 Seborrheic Keratosis  What causes seborrheic keratoses? Seborrheic keratoses are harmless, common skin growths that first appear during adult life.  As time goes by, more growths appear.  Some people may develop a large number of them.  Seborrheic keratoses appear on both covered and uncovered body parts.  They are not caused by sunlight.  The tendency to develop seborrheic keratoses can be inherited.  They vary in color from skin-colored to gray, brown, or even black.  They can be either smooth or have a rough, warty surface.   Seborrheic keratoses are superficial and look as if they were stuck on the skin.  Under the microscope this type of keratosis looks like layers upon layers of skin.  That is why at times the top layer may seem to fall off, but the rest of the growth remains and re-grows.    Treatment Seborrheic keratoses do not need to be treated, but can easily be removed in the office.  Seborrheic keratoses often cause symptoms when they rub on clothing or jewelry.  Lesions can be in the way of shaving.  If they become inflamed, they can cause itching, soreness, or burning.  Removal of a seborrheic keratosis can be accomplished by freezing, burning, or surgery. If any spot bleeds, scabs, or grows rapidly, please return to have it checked, as these can be an indication of a skin cancer.   Cryotherapy Aftercare  Wash gently with soap and water everyday.   Apply Vaseline and Band-Aid daily until healed.    Actinic keratoses are precancerous spots that appear secondary to cumulative UV radiation exposure/sun exposure over time. They are chronic with expected duration over 1 year. A portion of actinic keratoses will progress to squamous cell carcinoma of the skin. It is not possible to reliably predict which spots will progress to skin cancer and so treatment is recommended to prevent development of skin cancer.  Recommend daily broad spectrum sunscreen SPF 30+ to sun-exposed areas, reapply  every 2 hours as needed.  Recommend staying in the shade or wearing long sleeves, sun glasses (UVA+UVB protection) and wide brim hats (4-inch brim around the entire circumference of the hat). Call for new or changing lesions.     Melanoma ABCDEs  Melanoma is the most dangerous type of skin cancer, and is the leading cause of death from skin disease.  You are more likely to develop melanoma if you: Have light-colored skin, light-colored eyes, or red or blond hair Spend a lot of time in the sun Tan regularly, either outdoors or in a tanning bed Have had blistering sunburns, especially during childhood Have a close family member who has had a melanoma Have atypical moles or large birthmarks  Early detection of melanoma is key since treatment is typically straightforward and cure rates are extremely high if we catch it early.   The first sign of melanoma is often a change in a mole or a new dark spot.  The ABCDE system is a way of remembering the signs of melanoma.  A for asymmetry:  The two halves do not match. B for border:  The edges of the growth are irregular. C for color:  A mixture of colors are present instead of an even brown color. D for diameter:  Melanomas are usually (but not always) greater than 6mm - the size of a pencil eraser. E for evolution:  The spot keeps changing in size, shape, and color.  Please check your skin once per month between visits. You can  use a small mirror in front and a large mirror behind you to keep an eye on the back side or your body.   If you see any new or changing lesions before your next follow-up, please call to schedule a visit.  Please continue daily skin protection including broad spectrum sunscreen SPF 30+ to sun-exposed areas, reapplying every 2 hours as needed when you're outdoors.   Staying in the shade or wearing long sleeves, sun glasses (UVA+UVB protection) and wide brim hats (4-inch brim around the entire circumference of the hat)  are also recommended for sun protection.    Due to recent changes in healthcare laws, you may see results of your pathology and/or laboratory studies on MyChart before the doctors have had a chance to review them. We understand that in some cases there may be results that are confusing or concerning to you. Please understand that not all results are received at the same time and often the doctors may need to interpret multiple results in order to provide you with the best plan of care or course of treatment. Therefore, we ask that you please give us  2 business days to thoroughly review all your results before contacting the office for clarification. Should we see a critical lab result, you will be contacted sooner.   If You Need Anything After Your Visit  If you have any questions or concerns for your doctor, please call our main line at [phone] and press option 4 to reach your doctor's medical assistant. If no one answers, please leave a voicemail as directed and we will return your call as soon as possible. Messages left after 4 pm will be answered the following business day.   You may also send us  a message via MyChart. We typically respond to MyChart messages within 1-2 business days.  For prescription refills, please ask your pharmacy to contact our office. Our fax number is [phone].  If you have an urgent issue when the clinic is closed that cannot wait until the next business day, you can page your doctor at the number below.    Please note that while we do our best to be available for urgent issues outside of office hours, we are not available 24/7.   If you have an urgent issue and are unable to reach us , you may choose to seek medical care at your doctor's office, retail clinic, urgent care center, or emergency room.  If you have a medical emergency, please immediately call 911 or go to the emergency department.  Pager Numbers  - Dr. Hester: [phone]  - Dr. Jackquline:  [phone]  - Dr. Claudene: [phone]   - Dr. Raymund: [phone]  In the event of inclement weather, please call our main line at [phone] for an update on the status of any delays or closures.  Dermatology Medication Tips: Please keep the boxes that topical medications come in in order to help keep track of the instructions about where and how to use these. Pharmacies typically print the medication instructions only on the boxes and not directly on the medication tubes.   If your medication is too expensive, please contact our office at [phone] option 4 or send us  a message through MyChart.   We are unable to tell what your co-pay for medications will be in advance as this is different depending on your insurance coverage. However, we may be able to find a substitute medication at lower cost or fill out paperwork to get insurance to cover  a needed medication.   If a prior authorization is required to get your medication covered by your insurance company, please allow us  1-2 business days to complete this process.  Drug prices often vary depending on where the prescription is filled and some pharmacies may offer cheaper prices.  The website www.goodrx.com contains coupons for medications through different pharmacies. The prices here do not account for what the cost may be with help from insurance (it may be cheaper with your insurance), but the website can give you the price if you did not use any insurance.  - You can print the associated coupon and take it with your prescription to the pharmacy.  - You may also stop by our office during regular business hours and pick up a GoodRx coupon card.  - If you need your prescription sent electronically to a different pharmacy, notify our office through [facility] or by phone at [phone] option 4.     Si Usted Necesita Algo Despus de Su Visita  Tambin puede enviarnos un mensaje a travs de Clinical cytogeneticist. Por lo general  respondemos a los mensajes de MyChart en el transcurso de 1 a 2 das hbiles.  Para renovar recetas, por favor pida a su farmacia que se ponga en contacto con nuestra oficina. Randi lakes de fax es [place] [phone].  Si tiene un asunto urgente cuando la clnica est cerrada y que no puede esperar hasta el siguiente da hbil, puede llamar/localizar a su doctor(a) al nmero que aparece a continuacin.   Por favor, tenga en cuenta que aunque hacemos todo lo posible para estar disponibles para asuntos urgentes fuera del horario de [place], no estamos disponibles las 24 horas del da, los 7 [address]  [address] de la [place].   Si tiene un problema urgente y no puede comunicarse con nosotros, puede optar por buscar atencin mdica  en el consultorio de su doctor(a), en una clnica privada, en un centro de atencin urgente o en una sala de emergencias.  Si tiene Engineer, drilling, por favor llame inmediatamente al 911 o vaya a la sala de emergencias.  Nmeros de bper  - Dr. Hester: [phone]  - Dra. Jackquline: [phone]  - Dr. Claudene: [phone]  - Dra. Kitts: [phone]  En caso de inclemencias del [place], por favor llame a nuestra lnea principal al [phone] para una actualizacin sobre el [place] de cualquier retraso o cierre.  Consejos para la medicacin en dermatologa: Por favor, guarde las cajas en las que vienen los medicamentos de uso tpico para ayudarle a seguir las instrucciones sobre dnde y cmo usarlos. Las farmacias generalmente imprimen las instrucciones del medicamento slo en las cajas y no directamente en los tubos del [place].   Si su medicamento es muy caro, por favor, pngase en contacto con landry rieger llamando al [phone] y presione la opcin 4 o envenos un mensaje a travs de Clinical cytogeneticist.   No podemos decirle cul ser su copago por los medicamentos por adelantado ya que esto es diferente dependiendo de la cobertura de su seguro. Sin embargo, es posible  que podamos encontrar un medicamento sustituto a Audiological scientist un formulario para que el seguro cubra el medicamento que se considera necesario.   Si se requiere una autorizacin previa para que su compaa de seguros [place] su medicamento, por favor permtanos de 1 a 2 das hbiles para completar este proceso.  Los precios de los medicamentos varan con frecuencia dependiendo del Environmental consultant de dnde se surte la receta y [place]  pueden ofrecer precios ms baratos.  El sitio web www.goodrx.com tiene cupones para medicamentos de Health and safety inspector. Los precios aqu no tienen en cuenta lo que podra costar con la ayuda del seguro (puede ser ms barato con su seguro), pero el sitio web puede darle el precio si no utiliz Tourist information centre manager.  - Puede imprimir el cupn correspondiente y llevarlo con su receta a la farmacia.  - Tambin puede pasar por nuestra oficina durante el horario de atencin regular y Education officer, museum una tarjeta de cupones de GoodRx.  - Si necesita que su receta se enve electrnicamente a una farmacia diferente, informe a nuestra oficina a travs de MyChart de [place] o por telfono llamando al [phone] y presione la opcin 4.

## 2023-09-07 NOTE — Progress Notes (Signed)
 Follow-Up Visit   Subjective  Barbara Waters is a 78 y.o. female who presents for the following: Skin Cancer Screening and Full Body Skin Exam Hx of bcc Hx rosacea  Hx of aks Hx of isks Here with husband who contributes to history.   The patient presents for Total-Body Skin Exam (TBSE) for skin cancer screening and mole check. The patient has spots, moles and lesions to be evaluated, some may be new or changing and the patient may have concern these could be cancer.  The following portions of the chart were reviewed this encounter and updated as appropriate: medications, allergies, medical history  Review of Systems:  No other skin or systemic complaints except as noted in HPI or Assessment and Plan.  Objective  Well appearing patient in no apparent distress; mood and affect are within normal limits.  A full examination was performed including scalp, head, eyes, ears, nose, lips, neck, chest, axillae, abdomen, back, buttocks, bilateral upper extremities, bilateral lower extremities, hands, feet, fingers, toes, fingernails, and toenails. All findings within normal limits unless otherwise noted below.   Relevant physical exam findings are noted in the Assessment and Plan.  arms, hands, face x 9 (9) Erythematous stuck-on, waxy papule or plaque face x 1 Erythematous thin papules/macules with gritty scale.   Assessment & Plan   SKIN CANCER SCREENING PERFORMED TODAY.  ACTINIC DAMAGE - Chronic condition, secondary to cumulative UV/sun exposure - diffuse scaly erythematous macules with underlying dyspigmentation - Recommend daily broad spectrum sunscreen SPF 30+ to sun-exposed areas, reapply every 2 hours as needed.  - Staying in the shade or wearing long sleeves, sun glasses (UVA+UVB protection) and wide brim hats (4-inch brim around the entire circumference of the hat) are also recommended for sun protection.  - Call for new or changing lesions.  LENTIGINES, SEBORRHEIC KERATOSES,  HEMANGIOMAS - Benign normal skin lesions - Benign-appearing - Call for any changes  MELANOCYTIC NEVI - Tan-brown and/or pink-flesh-colored symmetric macules and papules - Benign appearing on exam today - Observation - Call clinic for new or changing moles - Recommend daily use of broad spectrum spf 30+ sunscreen to sun-exposed areas.   EPIDERMAL INCLUSION CYST Exam: Subcutaneous nodule at left posterior neck  Benign-appearing. Exam most consistent with an epidermal inclusion cyst. Discussed that a cyst is a benign growth that can grow over time and sometimes get irritated or inflamed. Recommend observation if it is not bothersome. Discussed option of surgical excision to remove it if it is growing, symptomatic, or other changes noted. Please call for new or changing lesions so they can be evaluated.  HISTORY OF BASAL CELL CARCINOMA OF THE SKIN 02/04/21 at left upper lip ED&C Other areas see history  - No evidence of recurrence today - Recommend regular full body skin exams - Recommend daily broad spectrum sunscreen SPF 30+ to sun-exposed areas, reapply every 2 hours as needed.  - Call if any new or changing lesions are noted between office visits   INFLAMED SEBORRHEIC KERATOSIS (9) arms, hands, face x 9 (9) Symptomatic, irritating, patient would like treated. Destruction of lesion - arms, hands, face x 9 (9) Complexity: simple   Destruction method: cryotherapy   Informed consent: discussed and consent obtained   Timeout:  patient name, date of birth, surgical site, and procedure verified Lesion destroyed using liquid nitrogen: Yes   Region frozen until ice ball extended beyond lesion: Yes   Outcome: patient tolerated procedure well with no complications   Post-procedure details: wound care  instructions given    ACTINIC KERATOSIS face x 1 Actinic keratoses are precancerous spots that appear secondary to cumulative UV radiation exposure/sun exposure over time. They are chronic  with expected duration over 1 year. A portion of actinic keratoses will progress to squamous cell carcinoma of the skin. It is not possible to reliably predict which spots will progress to skin cancer and so treatment is recommended to prevent development of skin cancer.  Recommend daily broad spectrum sunscreen SPF 30+ to sun-exposed areas, reapply every 2 hours as needed.  Recommend staying in the shade or wearing long sleeves, sun glasses (UVA+UVB protection) and wide brim hats (4-inch brim around the entire circumference of the hat). Call for new or changing lesions. Destruction of lesion - face x 1 Complexity: simple   Destruction method: cryotherapy   Informed consent: discussed and consent obtained   Timeout:  patient name, date of birth, surgical site, and procedure verified Lesion destroyed using liquid nitrogen: Yes   Region frozen until ice ball extended beyond lesion: Yes   Outcome: patient tolerated procedure well with no complications   Post-procedure details: wound care instructions given    ROSACEA  Rosacea Face Exam: mild pinkness at mid cheeks and nose Chronic and persistent condition with duration or expected duration over one year. Condition is improving with treatment but not currently at goal. Rosacea is a chronic progressive skin condition usually affecting the face of adults, causing redness and/or acne bumps. It is treatable but not curable. It sometimes affects the eyes (ocular rosacea) as well. It may respond to topical and/or systemic medication and can flare with stress, sun exposure, alcohol , exercise, topical steroids (including hydrocortisone/cortisone 10) and some foods.  Daily application of broad spectrum spf 30+ sunscreen to face is recommended to reduce flares.   Restart pills but use as needed for flares Doxycycline  20 mg po QD   Doxycycline  should be taken with food to prevent nausea. Do not lay down for 30 minutes after taking. Be cautious with sun  exposure and use good sun protection while on this medication. Pregnant women should not take this medication.   Continue Medicinals rosacea Azelaic Acid: 15%, Ivermectin: 1%, Metronidazole : 1%, Vehicle: Cream mix at bedtime  Related Medications doxycycline  (PERIOSTAT ) 20 MG tablet Take one tab po qd with food. Return for 1 year tbse .  IEleanor Blush, CMA, am acting as scribe for Alm Rhyme, MD.   Documentation: I have reviewed the above documentation for accuracy and completeness, and I agree with the above.  Alm Rhyme, MD

## 2023-09-22 DIAGNOSIS — H35379 Puckering of macula, unspecified eye: Secondary | ICD-10-CM | POA: Diagnosis not present

## 2023-09-22 DIAGNOSIS — H43813 Vitreous degeneration, bilateral: Secondary | ICD-10-CM | POA: Diagnosis not present

## 2023-09-22 DIAGNOSIS — Z961 Presence of intraocular lens: Secondary | ICD-10-CM | POA: Diagnosis not present

## 2023-11-09 DIAGNOSIS — E7800 Pure hypercholesterolemia, unspecified: Secondary | ICD-10-CM | POA: Diagnosis not present

## 2023-11-10 DIAGNOSIS — E7800 Pure hypercholesterolemia, unspecified: Secondary | ICD-10-CM | POA: Diagnosis not present

## 2023-11-16 DIAGNOSIS — Z0001 Encounter for general adult medical examination with abnormal findings: Secondary | ICD-10-CM | POA: Diagnosis not present

## 2023-11-16 DIAGNOSIS — F068 Other specified mental disorders due to known physiological condition: Secondary | ICD-10-CM | POA: Diagnosis not present

## 2023-11-16 DIAGNOSIS — Z1331 Encounter for screening for depression: Secondary | ICD-10-CM | POA: Diagnosis not present

## 2023-11-16 DIAGNOSIS — F03A0 Unspecified dementia, mild, without behavioral disturbance, psychotic disturbance, mood disturbance, and anxiety: Secondary | ICD-10-CM | POA: Diagnosis not present

## 2023-11-16 DIAGNOSIS — K219 Gastro-esophageal reflux disease without esophagitis: Secondary | ICD-10-CM | POA: Diagnosis not present

## 2023-11-16 DIAGNOSIS — E538 Deficiency of other specified B group vitamins: Secondary | ICD-10-CM | POA: Diagnosis not present

## 2023-11-16 DIAGNOSIS — M810 Age-related osteoporosis without current pathological fracture: Secondary | ICD-10-CM | POA: Diagnosis not present

## 2023-11-16 DIAGNOSIS — E7800 Pure hypercholesterolemia, unspecified: Secondary | ICD-10-CM | POA: Diagnosis not present

## 2024-09-12 ENCOUNTER — Ambulatory Visit: Admitting: Dermatology
